# Patient Record
Sex: Female | Born: 1960 | Race: White | Hispanic: No | Marital: Married | Smoking: Never smoker
Health system: Southern US, Community
[De-identification: ages and names within clinical notes are randomized; demographics above are authoritative.]

## PROBLEM LIST (undated history)

## (undated) DIAGNOSIS — IMO0001 Reserved for inherently not codable concepts without codable children: Secondary | ICD-10-CM

## (undated) DIAGNOSIS — I10 Essential (primary) hypertension: Secondary | ICD-10-CM

## (undated) DIAGNOSIS — E079 Disorder of thyroid, unspecified: Secondary | ICD-10-CM

## (undated) DIAGNOSIS — K219 Gastro-esophageal reflux disease without esophagitis: Secondary | ICD-10-CM

## (undated) DIAGNOSIS — E785 Hyperlipidemia, unspecified: Secondary | ICD-10-CM

## (undated) HISTORY — PX: OTHER SURGICAL HISTORY: SHX169

## (undated) HISTORY — DX: Hyperlipidemia, unspecified: E78.5

## (undated) HISTORY — PX: CHOLECYSTECTOMY, LAPAROSCOPIC: SHX56

## (undated) HISTORY — PX: CHOLECYSTECTOMY: SHX55

## (undated) HISTORY — DX: Gastro-esophageal reflux disease without esophagitis: K21.9

## (undated) HISTORY — DX: Disorder of thyroid, unspecified: E07.9

## (undated) HISTORY — PX: TUBAL LIGATION: SHX77

## (undated) HISTORY — DX: Essential (primary) hypertension: I10

---

## 2006-11-25 ENCOUNTER — Other Ambulatory Visit: Payer: Self-pay

## 2006-11-25 ENCOUNTER — Emergency Department: Payer: Self-pay | Admitting: Emergency Medicine

## 2006-11-25 ENCOUNTER — Ambulatory Visit: Payer: Self-pay | Admitting: Emergency Medicine

## 2006-12-29 ENCOUNTER — Ambulatory Visit: Payer: Self-pay | Admitting: Surgery

## 2009-06-07 LAB — CONVERTED CEMR LAB: Pap Smear: NORMAL

## 2009-06-18 ENCOUNTER — Ambulatory Visit: Payer: Self-pay | Admitting: Unknown Physician Specialty

## 2010-02-04 ENCOUNTER — Ambulatory Visit: Payer: Self-pay | Admitting: Family Medicine

## 2010-02-04 DIAGNOSIS — M722 Plantar fascial fibromatosis: Secondary | ICD-10-CM | POA: Insufficient documentation

## 2010-02-04 DIAGNOSIS — I10 Essential (primary) hypertension: Secondary | ICD-10-CM | POA: Insufficient documentation

## 2010-02-04 LAB — CONVERTED CEMR LAB
Bilirubin Urine: NEGATIVE
Nitrite: NEGATIVE
Urobilinogen, UA: 0.2

## 2010-02-05 LAB — CONVERTED CEMR LAB
Albumin: 4.6 g/dL (ref 3.5–5.2)
Alkaline Phosphatase: 47 units/L (ref 39–117)
Basophils Relative: 1.1 % (ref 0.0–3.0)
CO2: 31 meq/L (ref 19–32)
Chloride: 109 meq/L (ref 96–112)
Cholesterol: 238 mg/dL — ABNORMAL HIGH (ref 0–200)
Direct LDL: 156.6 mg/dL
Eosinophils Absolute: 0.1 10*3/uL (ref 0.0–0.7)
Hemoglobin: 13.7 g/dL (ref 12.0–15.0)
MCHC: 33.7 g/dL (ref 30.0–36.0)
MCV: 93.6 fL (ref 78.0–100.0)
Monocytes Absolute: 0.7 10*3/uL (ref 0.1–1.0)
Neutro Abs: 3.7 10*3/uL (ref 1.4–7.7)
RBC: 4.33 M/uL (ref 3.87–5.11)
Sodium: 145 meq/L (ref 135–145)
Total Protein: 7.9 g/dL (ref 6.0–8.3)
Triglycerides: 55 mg/dL (ref 0.0–149.0)

## 2010-02-06 ENCOUNTER — Telehealth: Payer: Self-pay | Admitting: Family Medicine

## 2010-02-20 ENCOUNTER — Ambulatory Visit: Payer: Self-pay | Admitting: Family Medicine

## 2010-02-22 LAB — CONVERTED CEMR LAB
CO2: 33 meq/L — ABNORMAL HIGH (ref 19–32)
Chloride: 102 meq/L (ref 96–112)
Creatinine, Ser: 0.8 mg/dL (ref 0.4–1.2)
Potassium: 4 meq/L (ref 3.5–5.1)

## 2010-05-16 ENCOUNTER — Ambulatory Visit: Payer: Self-pay | Admitting: Family Medicine

## 2010-05-16 DIAGNOSIS — E7800 Pure hypercholesterolemia, unspecified: Secondary | ICD-10-CM | POA: Insufficient documentation

## 2010-05-20 LAB — CONVERTED CEMR LAB
Direct LDL: 150.2 mg/dL
HDL: 52 mg/dL (ref >39.00)
Total CHOL/HDL Ratio: 4
Triglycerides: 70 mg/dL (ref 0.0–149.0)
VLDL: 14 mg/dL (ref 0.0–40.0)

## 2010-05-22 ENCOUNTER — Ambulatory Visit: Payer: Self-pay | Admitting: Family Medicine

## 2010-07-08 LAB — HM MAMMOGRAPHY: HM Mammogram: NORMAL

## 2010-07-08 LAB — HM PAP SMEAR

## 2010-07-23 ENCOUNTER — Ambulatory Visit: Payer: Self-pay | Admitting: Unknown Physician Specialty

## 2010-08-01 ENCOUNTER — Ambulatory Visit: Payer: Self-pay | Admitting: Unknown Physician Specialty

## 2010-08-26 ENCOUNTER — Ambulatory Visit: Payer: Self-pay | Admitting: Family Medicine

## 2010-09-03 LAB — CONVERTED CEMR LAB
ALT: 16 units/L (ref 0–35)
AST: 20 units/L (ref 0–37)
Albumin: 4.3 g/dL (ref 3.5–5.2)
Alkaline Phosphatase: 51 units/L (ref 39–117)
BUN: 18 mg/dL (ref 6–23)
CO2: 31 meq/L (ref 19–32)
Chloride: 103 meq/L (ref 96–112)
Direct LDL: 166.6 mg/dL
Glucose, Bld: 95 mg/dL (ref 70–99)
HDL: 57 mg/dL (ref >39.00)
Potassium: 4.5 meq/L (ref 3.5–5.1)
Total Protein: 7.4 g/dL (ref 6.0–8.3)

## 2010-11-19 ENCOUNTER — Ambulatory Visit: Payer: Self-pay | Admitting: Family Medicine

## 2010-11-19 DIAGNOSIS — L719 Rosacea, unspecified: Secondary | ICD-10-CM

## 2010-11-27 ENCOUNTER — Ambulatory Visit: Payer: Self-pay | Admitting: Family Medicine

## 2010-12-03 LAB — CONVERTED CEMR LAB
ALT: 16 units/L (ref 0–35)
Cholesterol: 207 mg/dL — ABNORMAL HIGH (ref 0–200)
HDL: 68.2 mg/dL (ref >39.00)
Total CHOL/HDL Ratio: 3
Triglycerides: 56 mg/dL (ref 0.0–149.0)

## 2010-12-08 HISTORY — PX: PLANTAR FASCIA SURGERY: SHX746

## 2011-01-07 NOTE — Progress Notes (Signed)
Summary: Want lab results and discuss medication   Phone Note Call from Patient   Summary of Call: Pt called for lab results...mailed pt a copy.. Pt says Dr. Ermalene Searing discuss some medication she wants her to try, after her labs results. Please call pt to discuss -Told pt someone would call her back on tomorrow, pt ok.Daine Gip  February 06, 2010 4:43 PM   Initial call taken by: Daine Gip,  February 06, 2010 4:42 PM  Follow-up for Phone Call        Spoke with pt, she wanted medicine called to [facility], which I did.  Cancelled script at [facility] in [place] Follow-up by: Lowella Petties CMA,  February 07, 2010 4:36 PM    New/Updated Medications: LISINOPRIL-HYDROCHLOROTHIAZIDE 10-12.5 MG TABS (LISINOPRIL-HYDROCHLOROTHIAZIDE) 1 tab by mouth daily Prescriptions: LISINOPRIL-HYDROCHLOROTHIAZIDE 10-12.5 MG TABS (LISINOPRIL-HYDROCHLOROTHIAZIDE) 1 tab by mouth daily  #30 x 3   Entered by:   Lowella Petties CMA   Authorized by:   Kerby Nora MD   Signed by:   Lowella Petties CMA on 02/07/2010   Method used:   Electronically to        [facility]* (retail)       [address] [place], [place]  [address]       Ph: [phone]       Fax: [phone]   RxID:   [id number] LISINOPRIL-HYDROCHLOROTHIAZIDE 10-12.5 MG TABS (LISINOPRIL-HYDROCHLOROTHIAZIDE) 1 tab by mouth daily  #30 x 3   Entered and Authorized by:   Kerby Nora MD   Signed by:   Kerby Nora MD on 02/06/2010   Method used:   Electronically to        [facility]* (retail)       [address] [place], [place]  [address]       Ph: [phone]       Fax: [phone]   RxID:   [id number]

## 2011-01-07 NOTE — Assessment & Plan Note (Signed)
Summary: 2 week follow up bp/rbh   Vital Signs:  Patient profile:   50 year old female Height:      63.5 inches Weight:      180.4 pounds BMI:     31.57 Temp:     98.1 degrees F oral Pulse rate:   80 / minute Pulse rhythm:   regular BP sitting:   118 / 80  (left arm) Cuff size:   regular  Vitals Entered By: Benny Lennert CMA Duncan Dull) (February 20, 2010 3:40 PM)  History of Present Illness: Chief complaint 2 wk follow up Blood pressure  HTN, improved control on low dose lisinopril HCTZ. Has had cough occur in apst few day.  BPs 117-120/74-89  HR 76.  Has started weight wathcer..has lost 5 lbs. Walking daily.  Problems Prior to Update: 1)  Neoplasm, Malignant, Colon, Family Hx, Father  (ICD-V16.0) 2)  Essential Hypertension, Benign  (ICD-401.1) 3)  Fasciitis, Plantar  (ICD-728.71)  Allergies (verified): No Known Drug Allergies  Past History:  Past medical, surgical, family and social histories (including risk factors) reviewed, and no changes noted (except as noted below).  Past Surgical History: Reviewed history from 02/04/2010 and no changes required. 2 C Section for eclampsia and repeat Cholecystectomy Plantar fasciitis surgery 12/2009  Family History: Reviewed history from 02/04/2010 and no changes required. father: rectal cancer, lung cancer, Crohn's mother: COPD brother: HTN sister: Crohn's PGM: DM MGF: MI age 55s  Social History: Reviewed history from 02/04/2010 and no changes required. 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 CV:  Denies chest pain or discomfort and swelling of feet. Resp:  Complains of cough; denies shortness of breath, sputum productive, and wheezing.  Physical Exam  General:  Overweight appearing female in NAd Mouth:  Oral mucosa and oropharynx without lesions or exudates.  Teeth in good  repair. Neck:  no carotid bruit or thyromegaly no cervical or supraclavicular lymphadenopathy  Lungs:  Normal respiratory effort, chest expands symmetrically. Lungs are clear to auscultation, no crackles or wheezes. Heart:  Normal rate and regular rhythm. S1 and S2 normal without gallop, murmur, click, rub or other extra sounds. Pulses:  R and L posterior tibial pulses are full and equal bilaterally  Extremities:  no edema    Impression & Recommendations:  Problem # 1:  ESSENTIAL HYPERTENSION, BENIGN (ICD-401.1) EKG today showed NSR, no ST change, no LVH.  Change to ARB due to cough with ACEI. Follow BPs at home. Continue lifestyle change..follow up in 6 months. Call earlier if BP >140/90.  Her updated medication list for this problem includes:    Losartan Potassium-hctz 50-12.5 Mg Tabs (Losartan potassium-hctz) .Marland Kitchen... Take 1 tablet by mouth once a day  Orders: EKG w/ Interpretation (93000) TLB-BMP (Basic Metabolic Panel-BMET) (80048-METABOL)  Complete Medication List: 1)  Prilosec 20 Mg Cpdr (Omeprazole) .... Daily 2)  Losartan Potassium-hctz 50-12.5 Mg Tabs (Losartan potassium-hctz) .... Take 1 tablet by mouth once a day  Patient Instructions: 1)  Make appt for labs in 3 months for fasting LIPIDS Dx 272.0 2)  Stop lisinopril HCTZ... change to losartan HCTZ. 3)  Please schedule a follow-up appointment in 6 months .  4)  Follow BPs at home, call i fgreater than 140/90.  Prescriptions: LOSARTAN POTASSIUM-HCTZ 50-12.5 MG TABS (LOSARTAN POTASSIUM-HCTZ) Take 1 tablet by mouth once a day  #30 x 11   Entered and Authorized by:  Kerby Nora MD   Signed by:   Kerby Nora MD on 02/20/2010   Method used:   Electronically to        [id number] (ret[facility]       [address]       [place], [place]  [address]       Ph: [phone]       Fax: [phone]   RxID:   [id number]   Current Allergies (reviewed today): No known allergies

## 2011-01-07 NOTE — Assessment & Plan Note (Signed)
Summary: NEW PATIENT TO ESTABLISH   Vital Signs:  Patient profile:   50 year old female Height:      63.5 inches Weight:      185.6 pounds BMI:     32.48 Temp:     98.0 degrees F oral Pulse rate:   80 / minute Pulse rhythm:   regular BP sitting:   160 / 100  (left arm) Cuff size:   large  Vitals Entered By: Benny Lennert CMA Duncan Dull) (February 04, 2010 10:26 AM)  History of Present Illness: new patient to be established and high blood pressure  High blood pressure: Noted at last GYN appt. Some headaches, lightheadedness. 25 lb weight gain in last 2 years.  131-150/92-103  Starts at [employer] at work.. walk on track. Plans on starting weight watchers.   Preventive Screening-Counseling & Management  Alcohol-Tobacco     Smoking Status: never  Caffeine-Diet-Exercise     Does Patient Exercise: no      Drug Use:  no.    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 CV:  Denies chest pain or discomfort. Resp:  Denies shortness of breath. GI:  Denies abdominal pain. GU:  Denies dysuria and hematuria; last menstrual cycle  11/2009. Derm:  Denies rash. Psych:  Denies anxiety and depression. Endo:  Denies cold intolerance and heat intolerance.  Physical Exam  General:  Overweight appearing female in NAd Eyes:  No corneal or conjunctival inflammation noted. EOMI. Perrla. Funduscopic exam benign, without  hemorrhages, exudates or papilledema. Vision grossly normal. Mouth:  Oral mucosa and oropharynx without lesions or exudates.  Teeth in good repair. Neck:  no carotid bruit or thyromegaly no cervical or supraclavicular lymphadenopathy  Lungs:  Normal respiratory effort, chest expands symmetrically. Lungs are clear to auscultation, no crackles or wheezes. Heart:  Normal rate and regular rhythm. S1 and S2 normal without gallop, murmur, click, rub or other extra sounds. Abdomen:  Bowel sounds positive,abdomen soft and non-tender without masses, organomegaly or hernias noted. Pulses:  R and L posterior tibial pulses are full and equal bilaterally  Extremities:  no edema  Neurologic:  No cranial nerve deficits noted. Station and gait are normal.  Sensory, motor and coordinative functions appear intact. Skin:  Intact without suspicious lesions or rashes Psych:  Cognition and judgment appear intact. Alert and cooperative with normal attention span and concentration. No apparent delusions, illusions, hallucinations   Impression & Recommendations:  Problem # 1:  ESSENTIAL HYPERTENSION, BENIGN (ICD-401.1)  New diagnosis. Will eval for secondary causes, risk factors and end organ damage. EKG at next OV.  UA clear and eye exam normal.  Encouraged exercise, weight loss, healthy eating habits. Info given. Reviewed low salt diet, increasing magnesium, potassium and calcium.  If labs are noramle..will plan on starting HCTZ/lisinopril . Reviewed medicaiotn SE profile i detail.  45 min spent coordinating care and face to face with pt counsleing on new diagnosis and treatment.  Orders: UA Dipstick w/o Micro (manual) (01601) TLB-BMP (Basic Metabolic Panel-BMET) (  80048-METABOL) TLB-CBC Platelet - w/Differential (85025-CBCD) TLB-Hepatic/Liver Function Pnl (80076-HEPATIC) TLB-TSH (Thyroid Stimulating Hormone) (84443-TSH) TLB-Lipid Panel (80061-LIPID)  Her updated medication list for this problem includes:     Lisinopril-hydrochlorothiazide 10-12.5 Mg Tabs (Lisinopril-hydrochlorothiazide) .Marland Kitchen... 1 tab by mouth daily  Complete Medication List: 1)  Prilosec 20 Mg Cpdr (Omeprazole) .... Daily 2)  Lisinopril-hydrochlorothiazide 10-12.5 Mg Tabs (Lisinopril-hydrochlorothiazide) .Marland Kitchen.. 1 tab by mouth daily  Patient Instructions: 1)  Work on exercise, weight loss and low salt diet.  2)  Will call with lab results..will likely start lisinopril/HCTZ 10/12.5 mg daily. 3)  Follow up appt in 2 weeks BP.   Current Allergies (reviewed today): No known allergies   Flex Sig Next Due:  Not Indicated Colonoscopy Next Due:  5 yr Hemoccult Next Due:  Not Indicated PAP Result Date:  06/07/2009 PAP Result:  normal PAP Next Due:  1 yr Mammogram Result Date:  06/07/2009 Mammogram Result:  normal Mammogram Next Due:  1 yr  Laboratory Results   Urine Tests  Date/Time Received: February 04, 2010 11:30 AM  Date/Time Reported: February 04, 2010 11:30 AM   Routine Urinalysis   Color: yellow Appearance: Clear Glucose: negative   (Normal Range: Negative) Bilirubin: negative   (Normal Range: Negative) Ketone: negative   (Normal Range: Negative) Spec. Gravity: 1.015   (Normal Range: 1.003-1.035) Blood: negative   (Normal Range: Negative) pH: 6.0   (Normal Range: 5.0-8.0) Protein: negative   (Normal Range: Negative) Urobilinogen: 0.2   (Normal Range: 0-1) Nitrite: negative   (Normal Range: Negative) Leukocyte Esterace: negative   (Normal Range: Negative)        Appended Document: Orders Update please call pt..let her know last colonoscopy 2003... Dr. Esmeralda Arthur recommended repeat every 5 years iven family history...overdue. WIll send her for GI referral for colonoscopy. Kerby Nora MD  February 19, 2010 5:48 PM   Spoke with pt on cell # [phone]. Patient notified as instructed by telephone. Pt will wait to hear from [facility].Lewanda Rife LPN  February 22, 2010 8:12 AM  Patient will call back with appt date and  time with appt with Dr Volney Presser for her  colonoscopy. Clinical Lists Changes  Problems: Added new problem of NEOPLASM, MALIGNANT, COLON, FAMILY HX, FATHER (ICD-V16.0) - Signed Orders: Added new Referral order of Gastroenterology Referral (GI) - Signed

## 2011-01-07 NOTE — Assessment & Plan Note (Signed)
Summary: 3 M F/U DLO   Vital Signs:  Patient profile:   50 year old female Height:      63.5 inches Weight:      178.2 pounds BMI:     31.18 Temp:     98.1 degrees F oral Pulse rate:   80 / minute Pulse rhythm:   regular BP sitting:   120 / 84  (left arm) Cuff size:   large  Vitals Entered By: Benny Lennert CMA Duncan Dull) (May 22, 2010 3:15 PM)  History of Present Illness: CC: f/up   HTN, well controlled on losartan/HCTZ...BP at home runnning at goal..forgot med today.  High Cholesterol... Has been working on diet and exercise  2-3 times a week.   Problems Prior to Update: 1)  Screening For Lipoid Disorders  (ICD-V77.91) 2)  Neoplasm, Malignant, Colon, Family Hx, Father  (ICD-V16.0) 3)  Essential Hypertension, Benign  (ICD-401.1) 4)  Fasciitis, Plantar  (ICD-728.71)  Current Medications (verified): 1)  Prilosec 20 Mg Cpdr (Omeprazole) .... Daily 2)  Losartan Potassium-Hctz 50-12.5 Mg Tabs (Losartan Potassium-Hctz) .... Take 1 Tablet By Mouth Once A Day  Allergies (verified): No Known Drug Allergies  Past History:  Past medical, surgical, family and social histories (including risk factors) reviewed, and no changes noted (except as noted below).  Past Surgical History: Reviewed history from 02/04/2010 and no changes required. 2 C Section for eclampsia and repeat Cholecystectomy Plantar fasciitis surgery 12/2009  Family History: Reviewed history from 02/04/2010 and no changes required. father: rectal cancer, lung cancer, Crohn's mother: COPD brother: HTN sister: Crohn's PGM: DM MGF: MI age 24s  Social History: Reviewed history from 02/04/2010 and no changes required. 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 CV:  Denies chest pain or discomfort and swelling of feet. Resp:  Denies cough and shortness of breath. GI:   Denies abdominal pain. GU:  Denies dysuria.  Physical Exam  General:  Well-developed,well-nourished,in no acute distress; alert,appropriate and cooperative throughout examination Mouth:  Oral mucosa and oropharynx without lesions or exudates.  Teeth in good repair. Neck:  no carotid bruit or thyromegaly no cervical or supraclavicular lymphadenopathy  Lungs:  Normal respiratory effort, chest expands symmetrically. Lungs are clear to auscultation, no crackles or wheezes. Heart:  Normal rate and regular rhythm. S1 and S2 normal without gallop, murmur, click, rub or other extra sounds. Pulses:  R and L posterior tibial pulses are full and equal bilaterally  Extremities:  no edema    Impression & Recommendations:  Problem # 1:  ESSENTIAL HYPERTENSION, BENIGN (ICD-401.1) Well controlled. Continue current medication.  Her updated medication list for this problem includes:    Losartan Potassium-hctz 50-12.5 Mg Tabs (Losartan potassium-hctz) .Marland Kitchen... Take 1 tablet by mouth once a day  Problem # 2:  HYPERCHOLESTEROLEMIA (ICD-272.0) Improved but LDL not at goal <130. Discussed lifestyle change and weight loss. Recheck fasting LIPIDS, in 3 months Dx 272.0     Complete Medication List: 1)  Prilosec 20 Mg Cpdr (Omeprazole) .... Daily 2)  Losartan Potassium-hctz 50-12.5 Mg Tabs (Losartan potassium-hctz) .... Take 1 tablet by mouth once a day  Patient Instructions: 1)  Follow up in 6 months. BP check.  2)    3)  Lipid panel, CMET dX 272.0 in 3 months FASTING.  Current Allergies (reviewed today): No known allergies

## 2011-01-09 NOTE — Assessment & Plan Note (Signed)
Summary: 6 MONTH FOLLOW UP BP CHECK/RBH   Vital Signs:  Patient profile:   50 year old female Height:      63.5 inches Weight:      189.4 pounds BMI:     33.14 Temp:     98.1 degrees F oral Pulse rate:   80 / minute Pulse rhythm:   regular BP sitting:   108 / 78  (left arm) Cuff size:   large  Vitals Entered By: Benny Lennert CMA Duncan Dull) (November 19, 2010 3:13 PM)  History of Present Illness: Chief complaint 6 month up blood pressure  HTN, well controlled on losartan/HCTZ...BP at home runnning at goal.  10 lb weight gain.   High Cholesterol... Has been working on diet and exercise  2-3 times a week. Not at goal.. LDL >160 now. On pravastain 20 mg daily... very good about taking it. HAs scheduled 3 month check next week.    Lipid Management History:      Positive NCEP/ATP III risk factors include hypertension.  Negative NCEP/ATP III risk factors include female age less than 74 years old and non-tobacco-user status.     Preventive Screening-Counseling & Management  Caffeine-Diet-Exercise     Does Patient Exercise: yes     Exercise (avg: min/session): 30-60     Times/week: <3     Exercise Counseling: to improve exercise regimen  Problems Prior to Update: 1)  Hypercholesterolemia  (ICD-272.0) 2)  Neoplasm, Malignant, Colon, Family Hx, Father  (ICD-V16.0) 3)  Essential Hypertension, Benign  (ICD-401.1) 4)  Fasciitis, Plantar  (ICD-728.71)  Current Medications (verified): 1)  Prilosec 20 Mg Cpdr (Omeprazole) .... Daily 2)  Losartan Potassium-Hctz 50-12.5 Mg Tabs (Losartan Potassium-Hctz) .... Take 1 Tablet By Mouth Once A Day 3)  Pravastatin Sodium 20 Mg Tabs (Pravastatin Sodium) .... Take 1 Tablet By Mouth Once A Day  Allergies (verified): No Known Drug Allergies  Past History:  Past medical, surgical, family and social histories (including risk factors) reviewed, and no changes noted (except as noted below).  Past Surgical History: Reviewed history from  02/04/2010 and no changes required. 2 C Section for eclampsia and repeat Cholecystectomy Plantar fasciitis surgery 12/2009  Family History: Reviewed history from 02/04/2010 and no changes required. father: rectal cancer, lung cancer, Crohn's mother: COPD brother: HTN sister: Crohn's PGM: DM MGF: MI age 94s  Social History: Reviewed history from 02/04/2010 and no changes required. 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 CV:  Denies chest pain or discomfort. Resp:  Denies shortness of breath. GI:  Denies abdominal pain. GU:  Denies dysuria.  Physical Exam  General:  overweight female in NAD  Ears:  External ear exam shows no significant lesions or deformities.  Otoscopic examination reveals clear canals, tympanic membranes are intact bilaterally without bulging, retraction, inflammation or discharge. Hearing is grossly normal bilaterally. Nose:  External nasal examination shows no deformity or inflammation. Nasal mucosa are pink and moist without lesions or exudates. Mouth:  Oral mucosa and oropharynx without lesions or exudates.  Teeth in good repair. Neck:  no carotid bruit or thyromegaly no cervical or supraclavicular lymphadenopathy  Lungs:  Normal respiratory effort, chest expands symmetrically. Lungs are clear to auscultation, no crackles or wheezes. Heart:  Normal rate and regular rhythm. S1 and S2 normal without gallop, murmur, click, rub or other extra sounds. Abdomen:  Bowel sounds positive,abdomen soft  and non-tender without masses, organomegaly or hernias noted. Pulses:  R and L posterior tibial pulses are full and equal bilaterally  Extremities:  no edema  Skin:  erythrma and papules across nose , chin.. consistent with rosecea.  Psych:  Cognition and judgment appear intact. Alert and cooperative  with normal attention span and concentration. No apparent delusions, illusions, hallucinations   Impression & Recommendations:  Problem # 1:  ESSENTIAL HYPERTENSION, BENIGN (ICD-401.1) Well controlled. Continue current medication.  Her updated medication list for this problem includes:    Losartan Potassium-hctz 50-12.5 Mg Tabs (Losartan potassium-hctz) .Marland Kitchen... 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   Problem # 3:  HYPERCHOLESTEROLEMIA (ICD-272.0) Due for reeval... next week. Encouraged exercise, weight loss, healthy eating habits.  Her updated medication list for this problem includes:    Pravastatin Sodium 20 Mg Tabs (Pravastatin sodium) .Marland Kitchen... Take 1 tablet by mouth once a day  Complete Medication List: 1)  Prilosec 20 Mg Cpdr (Omeprazole) .... Daily 2)  Losartan Potassium-hctz 50-12.5 Mg Tabs (Losartan potassium-hctz) .... Take 1 tablet by mouth once a day 3)  Pravastatin Sodium 20 Mg Tabs (Pravastatin sodium) .... Take 1 tablet by mouth once a day 4)  Metronidazole 0.75 % Gel (Metronidazole) .... Apply to affected area two times a day  Lipid Assessment/Plan:      Based on NCEP/ATP III, the patient's risk factor category is "0-1 risk factors".  The patient's lipid goals are as follows: Total cholesterol goal is 200; LDL cholesterol goal is 160; HDL cholesterol goal is 40; Triglyceride goal is 150.  Her LDL cholesterol goal has not been met.    Patient Instructions: 1)  Increase cardiovascular exercise.Marland Kitchen 3-5 times a week.  2)  Please schedule a follow-up appointment in 1 year BP check.  Prescriptions: METRONIDAZOLE 0.75 % GEL (METRONIDAZOLE) Apply to affected Area two times a day  #15-30 gm x 1   Entered and Authorized by:   Kerby Nora MD   Signed by:   Kerby Nora MD on 11/19/2010   Method used:   Electronically to        [id number]       [address]        [place], [place]  [address]       Ph: [phone]       Fax: [phone]   RxID:   [id number]    Orders Added: 1)  Est. Patient Level IV [24401]    Current Allergies (reviewed today): No known allergies   Flu Vaccine Result Date:  09/07/2010 Flu Vaccine Result:  given Flu Vaccine Next Due:  1 yr Last PAP:  normal (06/07/2009 11:30:42 PM) PAP Result Date:  07/08/2010 PAP Result:  normal PAP Next Due:  1 yr Last Mammogram:  normal (06/07/2009 11:30:42 PM) Mammogram Result Date:  07/08/2010 Mammogram Result:  normal Mammogram Next Due:  1 yr Very last menses was in June.. minimal hot flashes

## 2011-04-21 ENCOUNTER — Telehealth: Payer: Self-pay | Admitting: *Deleted

## 2011-04-21 DIAGNOSIS — E7800 Pure hypercholesterolemia, unspecified: Secondary | ICD-10-CM

## 2011-04-21 NOTE — Telephone Encounter (Signed)
Patient is due to have lab work done in June. Patient does not know what she needs to have drawn? Please advise patient.

## 2011-04-23 NOTE — Telephone Encounter (Signed)
Addended by: Liane Comber on: 04/23/2011 08:05 AM   Modules accepted: Orders

## 2011-04-23 NOTE — Telephone Encounter (Signed)
Patient advised via message on home machine ok to schedule lab appt in june

## 2011-08-31 ENCOUNTER — Other Ambulatory Visit: Payer: Self-pay | Admitting: Family Medicine

## 2011-09-09 ENCOUNTER — Ambulatory Visit: Payer: Self-pay | Admitting: Unknown Physician Specialty

## 2012-03-04 ENCOUNTER — Telehealth: Payer: Self-pay | Admitting: Family Medicine

## 2012-03-04 DIAGNOSIS — E7800 Pure hypercholesterolemia, unspecified: Secondary | ICD-10-CM

## 2012-03-04 DIAGNOSIS — I10 Essential (primary) hypertension: Secondary | ICD-10-CM

## 2012-03-04 NOTE — Telephone Encounter (Signed)
Message copied by Excell Seltzer on Thu Mar 04, 2012 12:23 PM ------      Message from: Alvina Chou      Created: Thu Mar 04, 2012 11:00 AM       Patient is scheduled for [facility] labs, please order future labs, Thanks , Camelia Eng

## 2012-03-06 ENCOUNTER — Other Ambulatory Visit: Payer: Self-pay | Admitting: Family Medicine

## 2012-03-11 ENCOUNTER — Other Ambulatory Visit (INDEPENDENT_AMBULATORY_CARE_PROVIDER_SITE_OTHER): Payer: BC Managed Care – PPO

## 2012-03-11 DIAGNOSIS — I10 Essential (primary) hypertension: Secondary | ICD-10-CM

## 2012-03-11 DIAGNOSIS — E7800 Pure hypercholesterolemia, unspecified: Secondary | ICD-10-CM

## 2012-03-11 LAB — COMPREHENSIVE METABOLIC PANEL
Albumin: 4.3 g/dL (ref 3.5–5.2)
Alkaline Phosphatase: 55 U/L (ref 39–117)
BUN: 17 mg/dL (ref 6–23)
CO2: 30 mEq/L (ref 19–32)
Glucose, Bld: 98 mg/dL (ref 70–99)
Potassium: 4.3 mEq/L (ref 3.5–5.1)
Total Bilirubin: 0.6 mg/dL (ref 0.3–1.2)

## 2012-03-11 LAB — LIPID PANEL: HDL: 66.7 mg/dL (ref >39.00)

## 2012-05-06 ENCOUNTER — Other Ambulatory Visit: Payer: Self-pay | Admitting: Family Medicine

## 2012-06-28 ENCOUNTER — Encounter: Payer: Self-pay | Admitting: Family Medicine

## 2012-06-29 ENCOUNTER — Ambulatory Visit (INDEPENDENT_AMBULATORY_CARE_PROVIDER_SITE_OTHER): Payer: BC Managed Care – PPO | Admitting: Family Medicine

## 2012-06-29 ENCOUNTER — Encounter: Payer: Self-pay | Admitting: Family Medicine

## 2012-06-29 VITALS — BP 110/70 | HR 72 | Temp 98.5°F | Ht 63.5 in | Wt 190.8 lb

## 2012-06-29 DIAGNOSIS — I10 Essential (primary) hypertension: Secondary | ICD-10-CM

## 2012-06-29 DIAGNOSIS — E7800 Pure hypercholesterolemia, unspecified: Secondary | ICD-10-CM

## 2012-06-29 MED ORDER — LOSARTAN POTASSIUM-HCTZ 50-12.5 MG PO TABS: ORAL_TABLET | ORAL | Status: DC

## 2012-06-29 NOTE — Patient Instructions (Addendum)
Get back on track with healthy eating and exercise. Schedule [facility] with NO fasting labs prior in next few months.

## 2012-06-29 NOTE — Assessment & Plan Note (Signed)
Well controlled. Continue current medication.  

## 2012-06-29 NOTE — Assessment & Plan Note (Signed)
Encouraged exercise, weight loss, healthy eating habits. Get back on track.. Plan to recheck in 3 months.. Will schedule at [facility].

## 2012-06-29 NOTE — Progress Notes (Signed)
  Subjective:    Patient ID: Summer Hernandez, female    DOB: 1961-05-26, 51 y.o.   MRN: [id number]  HPI  Hypertension:  Well controlled on losartan HCTZ 50/12.5 mg daily  Using medication without problems or lightheadedness:  None Chest pain with exertion:None Edema:None Short of breath:None Average home BPs: not checking Other issues: Weight has fluctuated some up and down due to stress  Elevated Cholesterol: LDL at goal <160 but barely; pravastatin 20 mg daily Lab Results  Component Value Date   CHOL 234* 03/11/2012   HDL 66.70 03/11/2012   LDLDIRECT 156.5 03/11/2012   TRIG 105.0 03/11/2012   CHOLHDL 4 03/11/2012  Using medications without problems: None Muscle aches: None Diet compliance: poor Exercise:None Other complaints:   Sees GYN but plans to come here now. Next CPX is in 8/21.. Will change to here.  Review of Systems  Constitutional: Negative for fever and fatigue.  HENT: Negative for ear pain.   Eyes: Negative for pain.  Respiratory: Negative for chest tightness and shortness of breath.   Cardiovascular: Negative for chest pain, palpitations and leg swelling.  Gastrointestinal: Negative for abdominal pain.  Genitourinary: Negative for dysuria.       Objective:   Physical Exam  Constitutional: Vital signs are normal. She appears well-developed and well-nourished. She is cooperative.  Non-toxic appearance. She does not appear ill. No distress.  HENT:  Head: Normocephalic.  Right Ear: Hearing, tympanic membrane, external ear and ear canal normal. Tympanic membrane is not erythematous, not retracted and not bulging.  Left Ear: Hearing, tympanic membrane, external ear and ear canal normal. Tympanic membrane is not erythematous, not retracted and not bulging.  Nose: No mucosal edema or rhinorrhea. Right sinus exhibits no maxillary sinus tenderness and no frontal sinus tenderness. Left sinus exhibits no maxillary sinus tenderness and no frontal sinus tenderness.  Mouth/Throat:  Uvula is midline, oropharynx is clear and moist and mucous membranes are normal.  Eyes: Conjunctivae, EOM and lids are normal. Pupils are equal, round, and reactive to light. No foreign bodies found.  Neck: Trachea normal and normal range of motion. Neck supple. Carotid bruit is not present. No mass and no thyromegaly present.  Cardiovascular: Normal rate, regular rhythm, S1 normal, S2 normal, normal heart sounds, intact distal pulses and normal pulses.  Exam reveals no gallop and no friction rub.   No murmur heard. Pulmonary/Chest: Effort normal and breath sounds normal. Not tachypneic. No respiratory distress. She has no decreased breath sounds. She has no wheezes. She has no rhonchi. She has no rales.  Abdominal: Soft. Normal appearance and bowel sounds are normal. There is no tenderness.  Neurological: She is alert.  Skin: Skin is warm, dry and intact. No rash noted.  Psychiatric: Her speech is normal and behavior is normal. Judgment and thought content normal. Her mood appears not anxious. Cognition and memory are normal. She does not exhibit a depressed mood.          Assessment & Plan:

## 2012-09-02 ENCOUNTER — Encounter: Payer: BC Managed Care – PPO | Admitting: Family Medicine

## 2012-09-15 ENCOUNTER — Other Ambulatory Visit: Payer: Self-pay | Admitting: Family Medicine

## 2012-10-01 ENCOUNTER — Encounter: Payer: Self-pay | Admitting: Family Medicine

## 2012-10-01 ENCOUNTER — Ambulatory Visit (INDEPENDENT_AMBULATORY_CARE_PROVIDER_SITE_OTHER): Payer: BC Managed Care – PPO | Admitting: Family Medicine

## 2012-10-01 ENCOUNTER — Other Ambulatory Visit (HOSPITAL_COMMUNITY)
Admission: RE | Admit: 2012-10-01 | Discharge: 2012-10-01 | Disposition: A | Discharge status: Home / Self Care | Payer: BC Managed Care – PPO | Source: Ambulatory Visit | Attending: Family Medicine | Admitting: Family Medicine

## 2012-10-01 VITALS — BP 128/88 | HR 86 | Temp 97.6°F | Ht 63.0 in | Wt 194.0 lb

## 2012-10-01 DIAGNOSIS — Z01419 Encounter for gynecological examination (general) (routine) without abnormal findings: Secondary | ICD-10-CM

## 2012-10-01 DIAGNOSIS — Z1151 Encounter for screening for human papillomavirus (HPV): Secondary | ICD-10-CM | POA: Insufficient documentation

## 2012-10-01 DIAGNOSIS — I10 Essential (primary) hypertension: Secondary | ICD-10-CM

## 2012-10-01 DIAGNOSIS — Z1231 Encounter for screening mammogram for malignant neoplasm of breast: Secondary | ICD-10-CM

## 2012-10-01 DIAGNOSIS — Z0000 Encounter for general adult medical examination without abnormal findings: Secondary | ICD-10-CM

## 2012-10-01 DIAGNOSIS — E7800 Pure hypercholesterolemia, unspecified: Secondary | ICD-10-CM

## 2012-10-01 LAB — POCT URINALYSIS DIPSTICK
Blood, UA: NEGATIVE
Glucose, UA: NEGATIVE
Nitrite, UA: NEGATIVE
Urobilinogen, UA: NEGATIVE

## 2012-10-01 NOTE — Addendum Note (Signed)
Addended by: Criselda Peaches B on: 10/01/2012 04:25 PM   Modules accepted: Orders

## 2012-10-01 NOTE — Progress Notes (Signed)
Subjective:    Patient ID: Summer Hernandez, female    DOB: [birth year], 51 y.o.   MRN: [id number]  HPI  The patient is here for annual wellness exam and preventative care.    Has been struggling with stress from job issue and mother passing away. Doing better in last week with mood.  No exercise. Diet: poor. Wt Readings from Last 3 Encounters:  10/01/12 194 lb (87.998 kg)  06/29/12 190 lb 12 oz (86.524 kg)  11/19/10 189 lb 6.4 oz (85.911 kg)       Review of Systems  Constitutional: Negative for fever and fatigue.  HENT: Negative for ear pain.   Eyes: Negative for pain.  Respiratory: Negative for chest tightness and shortness of breath.   Cardiovascular: Negative for chest pain, palpitations and leg swelling.  Gastrointestinal: Negative for abdominal pain.  Genitourinary: Negative for dysuria.       Objective:   Physical Exam  Constitutional: Vital signs are normal. She appears well-developed and well-nourished. She is cooperative.  Non-toxic appearance. She does not appear ill. No distress.  HENT:  Head: Normocephalic.  Right Ear: Hearing, tympanic membrane, external ear and ear canal normal.  Left Ear: Hearing, tympanic membrane, external ear and ear canal normal.  Nose: Nose normal.  Eyes: Conjunctivae normal, EOM and lids are normal. Pupils are equal, round, and reactive to light. No foreign bodies found.  Neck: Trachea normal and normal range of motion. Neck supple. Carotid bruit is not present. No mass and no thyromegaly present.  Cardiovascular: Normal rate, regular rhythm, S1 normal, S2 normal, normal heart sounds and intact distal pulses.  Exam reveals no gallop.   No murmur heard. Pulmonary/Chest: Effort normal and breath sounds normal. No respiratory distress. She has no wheezes. She has no rhonchi. She has no rales.  Abdominal: Soft. Normal appearance and bowel sounds are normal. She exhibits no distension, no fluid wave, no abdominal bruit and no mass. There is no  hepatosplenomegaly. There is no tenderness. There is no rebound, no guarding and no CVA tenderness. No hernia.  Genitourinary: Vagina normal and uterus normal. No breast swelling, tenderness, discharge or bleeding. Pelvic exam was performed with patient prone. There is no rash, tenderness or lesion on the right labia. There is no rash, tenderness or lesion on the left labia. Uterus is not enlarged and not tender. Cervix exhibits no motion tenderness, no discharge and no friability. Right adnexum displays no mass, no tenderness and no fullness. Left adnexum displays no mass, no tenderness and no fullness.  Lymphadenopathy:    She has no cervical adenopathy.    She has no axillary adenopathy.  Neurological: She is alert. She has normal strength. No cranial nerve deficit or sensory deficit.  Skin: Skin is warm, dry and intact. No rash noted.  Psychiatric: Her speech is normal and behavior is normal. Judgment normal. Her mood appears not anxious. Cognition and memory are normal. She does not exhibit a depressed mood.          Assessment & Plan:  The patient's preventative maintenance and recommended screening tests for an annual wellness exam were reviewed in full today. Brought up to date unless services declined.  Counselled on the importance of diet, exercise, and its role in overall health and mortality. The patient's FH and SH was reviewed, including their home life, tobacco status, and drug and alcohol status.   Vaccines: Tdap and  Flu received at work. Colon: 01/2010 nml,  In [facility], repeat in 5 years  given father with anal cancer  Mammo: due  PAP/DVE: Last year at GYN, plans PAP/DVE here from now on.  Nonsmoker:

## 2012-10-01 NOTE — Assessment & Plan Note (Signed)
Well controlled. Continue current medication.  

## 2012-10-01 NOTE — Patient Instructions (Addendum)
Work on weight loss, regular exercise 3-5, healthy eating habits.  Stop by front desk to set up Mammogram.  We will call you with pap smear results.

## 2012-10-11 ENCOUNTER — Encounter: Payer: Self-pay | Admitting: Family Medicine

## 2012-10-11 LAB — HM PAP SMEAR: HM Pap smear: NORMAL

## 2012-10-12 ENCOUNTER — Ambulatory Visit: Payer: Self-pay | Admitting: Family Medicine

## 2012-10-14 LAB — HM MAMMOGRAPHY

## 2012-10-15 ENCOUNTER — Encounter: Payer: Self-pay | Admitting: Family Medicine

## 2012-10-26 ENCOUNTER — Ambulatory Visit: Payer: Self-pay | Admitting: Family Medicine

## 2012-10-29 ENCOUNTER — Encounter: Payer: Self-pay | Admitting: Family Medicine

## 2013-04-18 ENCOUNTER — Other Ambulatory Visit: Payer: Self-pay | Admitting: Family Medicine

## 2013-06-29 ENCOUNTER — Other Ambulatory Visit: Payer: Self-pay | Admitting: Family Medicine

## 2013-10-27 ENCOUNTER — Ambulatory Visit: Payer: Self-pay | Admitting: Family Medicine

## 2013-10-27 ENCOUNTER — Other Ambulatory Visit: Payer: Self-pay | Admitting: Family Medicine

## 2013-10-28 ENCOUNTER — Encounter: Payer: Self-pay | Admitting: Family Medicine

## 2013-11-21 ENCOUNTER — Other Ambulatory Visit (INDEPENDENT_AMBULATORY_CARE_PROVIDER_SITE_OTHER): Payer: BC Managed Care – PPO

## 2013-11-21 ENCOUNTER — Telehealth: Payer: Self-pay | Admitting: Family Medicine

## 2013-11-21 DIAGNOSIS — E7800 Pure hypercholesterolemia, unspecified: Secondary | ICD-10-CM

## 2013-11-21 LAB — LDL CHOLESTEROL, DIRECT: Direct LDL: 130.9 mg/dL

## 2013-11-21 LAB — COMPREHENSIVE METABOLIC PANEL
ALT: 15 U/L (ref 0–35)
Alkaline Phosphatase: 42 U/L (ref 39–117)
CO2: 31 mEq/L (ref 19–32)
Sodium: 140 mEq/L (ref 135–145)
Total Bilirubin: 0.7 mg/dL (ref 0.3–1.2)
Total Protein: 6.9 g/dL (ref 6.0–8.3)

## 2013-11-21 LAB — LIPID PANEL
Total CHOL/HDL Ratio: 3
VLDL: 10.4 mg/dL (ref 0.0–40.0)

## 2013-11-21 NOTE — Telephone Encounter (Signed)
Message copied by Excell Seltzer on Mon Nov 21, 2013  8:20 AM ------      Message from: Alvina Chou      Created: Fri Nov 11, 2013  2:55 PM      Regarding: Lab orders for Monday,12.15.14       Patient is scheduled for CPX labs, please order future labs, Thanks , Terri       ------

## 2013-11-25 ENCOUNTER — Ambulatory Visit (INDEPENDENT_AMBULATORY_CARE_PROVIDER_SITE_OTHER): Payer: BC Managed Care – PPO | Admitting: Family Medicine

## 2013-11-25 ENCOUNTER — Encounter: Payer: Self-pay | Admitting: Family Medicine

## 2013-11-25 VITALS — BP 100/74 | HR 80 | Temp 98.5°F | Ht 63.0 in | Wt 172.0 lb

## 2013-11-25 DIAGNOSIS — Z0000 Encounter for general adult medical examination without abnormal findings: Secondary | ICD-10-CM

## 2013-11-25 DIAGNOSIS — E7800 Pure hypercholesterolemia, unspecified: Secondary | ICD-10-CM

## 2013-11-25 DIAGNOSIS — Z23 Encounter for immunization: Secondary | ICD-10-CM

## 2013-11-25 DIAGNOSIS — I10 Essential (primary) hypertension: Secondary | ICD-10-CM

## 2013-11-25 MED ORDER — AZITHROMYCIN 250 MG PO TABS: ORAL_TABLET | ORAL | Status: DC

## 2013-11-25 NOTE — Patient Instructions (Addendum)
Work on regular exercise and keep up the healthy eating habits.  Continue mucinex DM but if not turning the corner in next 2-3 days.. Fill antibiotics.

## 2013-11-25 NOTE — Progress Notes (Signed)
The patient is here for annual wellness exam and preventative care.    She has had cough and congestion x 1 week, worse in last 24 hours. Productive yellow sputum.  No fever. No ear pain, no face pain, mild ST. Cough drops, mucinex DM.  No SOB.  Elevated Cholesterol: LDL at goal <130 on pravastatin Lab Results  Component Value Date   CHOL 218* 11/21/2013   HDL 78.20 11/21/2013   LDLDIRECT 130.9 11/21/2013   TRIG 52.0 11/21/2013   CHOLHDL 3 11/21/2013  Using medications without problems: none Muscle aches: None   Hypertension:   Well controlled on losartan HCTZ BP Readings from Last 3 Encounters:  11/25/13 100/74  10/01/12 128/88  06/29/12 110/70  Using medication without problems or lightheadedness: None Chest pain with exertion: none Edema:None Short of breath:None Average home BPs: not checking. Other issues:    Mood better this year. No depression, no anxiety. No exercise. Diet: moderate has been working on low carb diet. Has lost 2 lbs in last year.  Wt Readings from Last 3 Encounters:  11/25/13 172 lb (78.019 kg)  10/01/12 194 lb (87.998 kg)  06/29/12 190 lb 12 oz (86.524 kg)   Review of Systems  Constitutional: Negative for fever and fatigue.  HENT: Negative for ear pain.  Eyes: Negative for pain.  Respiratory: Negative for chest tightness and shortness of breath.  Cardiovascular: Negative for chest pain, palpitations and leg swelling.  Gastrointestinal: Negative for abdominal pain.  Genitourinary: Negative for dysuria.  Objective:   Physical Exam  Constitutional: Vital signs are normal. She appears well-developed and well-nourished. She is cooperative. Non-toxic appearance. She does not appear ill. No distress.  HENT:  Head: Normocephalic.  Right Ear: Hearing, tympanic membrane, external ear and ear canal normal.  Left Ear: Hearing, tympanic membrane, external ear and ear canal normal.  Nose: Nose normal.  Eyes: Conjunctivae normal, EOM and lids are  normal. Pupils are equal, round, and reactive to light. No foreign bodies found.  Neck: Trachea normal and normal range of motion. Neck supple. Carotid bruit is not present. No mass and no thyromegaly present.  Cardiovascular: Normal rate, regular rhythm, S1 normal, S2 normal, normal heart sounds and intact distal pulses. Exam reveals no gallop.  No murmur heard.  Pulmonary/Chest: Effort normal and breath sounds normal. No respiratory distress. She has no wheezes. She has no rhonchi. She has no rales.  Abdominal: Soft. Normal appearance and bowel sounds are normal. She exhibits no distension, no fluid wave, no abdominal bruit and no mass. There is no hepatosplenomegaly. There is no tenderness. There is no rebound, no guarding and no CVA tenderness. No hernia.  Genitourinary: Vagina normal and uterus normal. No breast swelling, tenderness, discharge or bleeding. Pelvic exam was performed with patient prone. There is no rash, tenderness or lesion on the right labia. There is no rash, tenderness or lesion on the left labia. Uterus is not enlarged and not tender.  No pap performed.  Right adnexum displays no mass, no tenderness and no fullness. Left adnexum displays no mass, no tenderness and no fullness.  Lymphadenopathy:  She has no cervical adenopathy.  She has no axillary adenopathy.  Neurological: She is alert. She has normal strength. No cranial nerve deficit or sensory deficit.  Skin: Skin is warm, dry and intact. No rash noted.  Psychiatric: Her speech is normal and behavior is normal. Judgment normal. Her mood appears not anxious. Cognition and memory are normal. She does not exhibit a depressed mood.  Assessment & Plan:   The patient's preventative maintenance and recommended screening tests for an annual wellness exam were reviewed in full today.  Brought up to date unless services declined.  Counselled on the importance of diet, exercise, and its role in overall health and mortality.  The  patient's FH and SH was reviewed, including their home life, tobacco status, and drug and alcohol status.   Vaccines: Tdap  uptodate and Flu given today. Colon: 01/2010 nml, In [facility], repeat in 5 years given father with anal cancer  Mammo: nml 10/2013 PAP/DVE: Nml pap 3 in a row, space pap to evert 2 then 3 years, yearly DVE. Nonsmoker.

## 2013-11-25 NOTE — Progress Notes (Signed)
Pre-visit discussion using our clinic review tool. No additional management support is needed unless otherwise documented below in the visit note.  

## 2014-04-29 ENCOUNTER — Other Ambulatory Visit: Payer: Self-pay | Admitting: Family Medicine

## 2014-08-03 ENCOUNTER — Other Ambulatory Visit: Payer: Self-pay | Admitting: Family Medicine

## 2014-11-04 ENCOUNTER — Other Ambulatory Visit: Payer: Self-pay | Admitting: Family Medicine

## 2014-11-05 ENCOUNTER — Other Ambulatory Visit: Payer: Self-pay | Admitting: Family Medicine

## 2014-11-06 ENCOUNTER — Ambulatory Visit: Payer: Self-pay | Admitting: Family Medicine

## 2014-11-07 ENCOUNTER — Encounter: Payer: Self-pay | Admitting: Family Medicine

## 2014-11-26 ENCOUNTER — Telehealth: Payer: Self-pay | Admitting: Family Medicine

## 2014-11-26 DIAGNOSIS — E7800 Pure hypercholesterolemia, unspecified: Secondary | ICD-10-CM

## 2014-11-26 NOTE — Telephone Encounter (Signed)
-----   Message from Alvina Chouerri J Walsh sent at 11/20/2014  3:54 PM EST ----- Regarding: Lab orders for Monday, 12.21.15 Patient is scheduled for CPX labs, please order future labs, Thanks , Camelia Engerri

## 2014-11-27 ENCOUNTER — Other Ambulatory Visit (INDEPENDENT_AMBULATORY_CARE_PROVIDER_SITE_OTHER): Payer: BC Managed Care – PPO

## 2014-11-27 DIAGNOSIS — E7800 Pure hypercholesterolemia, unspecified: Secondary | ICD-10-CM

## 2014-11-27 LAB — LIPID PANEL
CHOLESTEROL: 208 mg/dL — AB (ref 0–200)
HDL: 60.7 mg/dL (ref >39.00)
LDL CALC: 134 mg/dL — AB (ref 0–99)
NonHDL: 147.3
TRIGLYCERIDES: 69 mg/dL (ref 0.0–149.0)
Total CHOL/HDL Ratio: 3
VLDL: 13.8 mg/dL (ref 0.0–40.0)

## 2014-11-27 LAB — COMPREHENSIVE METABOLIC PANEL
ALK PHOS: 50 U/L (ref 39–117)
ALT: 19 U/L (ref 0–35)
AST: 18 U/L (ref 0–37)
Albumin: 4.4 g/dL (ref 3.5–5.2)
BUN: 18 mg/dL (ref 6–23)
CO2: 28 mEq/L (ref 19–32)
CREATININE: 0.8 mg/dL (ref 0.4–1.2)
Calcium: 9.5 mg/dL (ref 8.4–10.5)
Chloride: 103 mEq/L (ref 96–112)
GFR: 81.48 mL/min (ref >60.00)
Glucose, Bld: 99 mg/dL (ref 70–99)
Potassium: 4.2 mEq/L (ref 3.5–5.1)
Sodium: 139 mEq/L (ref 135–145)
Total Bilirubin: 0.6 mg/dL (ref 0.2–1.2)
Total Protein: 7.2 g/dL (ref 6.0–8.3)

## 2014-11-30 ENCOUNTER — Encounter: Payer: Self-pay | Admitting: Family Medicine

## 2014-11-30 ENCOUNTER — Ambulatory Visit (INDEPENDENT_AMBULATORY_CARE_PROVIDER_SITE_OTHER): Payer: BC Managed Care – PPO | Admitting: Family Medicine

## 2014-11-30 ENCOUNTER — Other Ambulatory Visit (HOSPITAL_COMMUNITY)
Admission: RE | Admit: 2014-11-30 | Discharge: 2014-11-30 | Disposition: A | Discharge status: Home / Self Care | Payer: BC Managed Care – PPO | Source: Ambulatory Visit | Attending: Family Medicine | Admitting: Family Medicine

## 2014-11-30 VITALS — BP 118/74 | HR 72 | Temp 98.2°F | Ht 63.0 in | Wt 177.5 lb

## 2014-11-30 DIAGNOSIS — M222X2 Patellofemoral disorders, left knee: Secondary | ICD-10-CM | POA: Insufficient documentation

## 2014-11-30 DIAGNOSIS — Z0000 Encounter for general adult medical examination without abnormal findings: Secondary | ICD-10-CM

## 2014-11-30 DIAGNOSIS — Z1211 Encounter for screening for malignant neoplasm of colon: Secondary | ICD-10-CM

## 2014-11-30 DIAGNOSIS — Z23 Encounter for immunization: Secondary | ICD-10-CM | POA: Diagnosis not present

## 2014-11-30 DIAGNOSIS — R42 Dizziness and giddiness: Secondary | ICD-10-CM

## 2014-11-30 DIAGNOSIS — Z01419 Encounter for gynecological examination (general) (routine) without abnormal findings: Secondary | ICD-10-CM | POA: Diagnosis present

## 2014-11-30 DIAGNOSIS — Z124 Encounter for screening for malignant neoplasm of cervix: Secondary | ICD-10-CM

## 2014-11-30 NOTE — Patient Instructions (Addendum)
Stop at front desk for referral for colonoscopy.  keep working on healthy eating and regular exercise.  For knee .Marland Kitchen. Consider glucosamine 500 mg 1-3 times a day with spells. Start home PT. Keep diary about dizzy spells. Contact me if increasing in intensity or frequency.

## 2014-11-30 NOTE — Progress Notes (Signed)
Pre visit review using our clinic review tool, if applicable. No additional management support is needed unless otherwise documented below in the visit note. 

## 2014-11-30 NOTE — Assessment & Plan Note (Signed)
Info given on home strengthening and use of glucosamine.  Follow up if not improving.

## 2014-11-30 NOTE — Progress Notes (Signed)
The patient is here for annual wellness exam and preventative care.   Pain in left knee. Only when she squats down. Some stiffness after sitting. No clicking, popping. Occ mild swelling in knee.  She has been having some pain in left knee,  No known fall or injury. No using medication for it. She is exercising more,  Doing squats jumping jacks, some running.  She has been having episodes of dizziness, feels presyncopal.  Takes deep breath and it goes away. She talks herself through it.  Not always associated with anxiety,not associated with skipping meals. No CP, no SOB.  No positional correlation.  She has been under a lot of stress at work.   Elevated Cholesterol: LDL at goal <130 on pravastatin.  Lab Results  Component Value Date   CHOL 208* 11/27/2014   HDL 60.70 11/27/2014   LDLCALC 134* 11/27/2014   LDLDIRECT 130.9 11/21/2013   TRIG 69.0 11/27/2014   CHOLHDL 3 11/27/2014  Using medications without problems: none Muscle aches: None   Hypertension:  Well controlled on losartan HCTZ  BP Readings from Last 3 Encounters:  11/30/14 118/74  11/25/13 100/74  10/01/12 128/88  Using medication without problems or lightheadedness: None Chest pain with exertion: none Edema:None Short of breath:None Average home BPs: not checking. Other issues:    Mood better this year. No depression, no anxiety. No exercise. Diet: moderate has been working on low carb diet. Has lost 2 lbs in last year.  Wt Readings from Last 3 Encounters:  11/30/14 177 lb 8 oz (80.513 kg)  11/25/13 172 lb (78.019 kg)  10/01/12 194 lb (87.998 kg)    reports that she has never smoked. She has never used smokeless tobacco. She reports that she drinks alcohol. She reports that she does not use illicit drugs.   Review of Systems  Constitutional: Negative for fever and fatigue.  HENT: Negative for ear pain.  Eyes: Negative for pain.  Respiratory: Negative for chest tightness and shortness of  breath.  Cardiovascular: Negative for chest pain, palpitations and leg swelling.  Gastrointestinal: Negative for abdominal pain.  Genitourinary: Negative for dysuria.  Objective:   Physical Exam  Constitutional: Vital signs are normal. She appears well-developed and well-nourished. She is cooperative. Non-toxic appearance. She does not appear ill. No distress.  HENT:  Head: Normocephalic.  Right Ear: Hearing, tympanic membrane, external ear and ear canal normal.  Left Ear: Hearing, tympanic membrane, external ear and ear canal normal.  Nose: Nose normal.  Eyes: Conjunctivae normal, EOM and lids are normal. Pupils are equal, round, and reactive to light. No foreign bodies found.  Neck: Trachea normal and normal range of motion. Neck supple. Carotid bruit is not present. No mass and no thyromegaly present.  Cardiovascular: Normal rate, regular rhythm, S1 normal, S2 normal, normal heart sounds and intact distal pulses. Exam reveals no gallop.  No murmur heard.  Pulmonary/Chest: Effort normal and breath sounds normal. No respiratory distress. She has no wheezes. She has no rhonchi. She has no rales.  Abdominal: Soft. Normal appearance and bowel sounds are normal. She exhibits no distension, no fluid wave, no abdominal bruit and no mass. There is no hepatosplenomegaly. There is no tenderness. There is no rebound, no guarding and no CVA tenderness. No hernia.  Genitourinary: Vagina normal and uterus normal. No breast swelling, tenderness, discharge or bleeding. Pelvic exam was performed with patient prone. There is no rash, tenderness or lesion on the right labia. There is no rash, tenderness or lesion on  the left labia. Uterus is not enlarged and not tender. PAP performed.  Right adnexum displays no mass, no tenderness and no fullness. Left adnexum displays no mass, no tenderness and no fullness.  Lymphadenopathy:  She has no cervical adenopathy.  She has no axillary adenopathy.   Neurological: She is alert. She has normal strength. No cranial nerve deficit or sensory deficit.  Skin: Skin is warm, dry and intact. No rash noted.  Psychiatric: Her speech is normal and behavior is normal. Judgment normal. Her mood appears not anxious. Cognition and memory are normal. She does not exhibit a depressed mood.   Neuro exam: No papilledema, nml cranial nerves, strength and sensation nml in upper and lower ext.  MSK: left knee, no tenderness at joint line, full ROM, no crepitus, Neg ant and posterior drawer, no swelling or redness. Assessment & Plan:   The patient's preventative maintenance and recommended screening tests for an annual wellness exam were reviewed in full today.  Brought up to date unless services declined.  Counselled on the importance of diet, exercise, and its role in overall health and mortality.  The patient's FH and SH was reviewed, including their home life, tobacco status, and drug and alcohol status.   Vaccines: Tdap uptodate and Flu  Given today.  Colon: 01/2010 nml, [place] [facility], repeat in 5 years given father with anal cancer  Mammo: nml 10/2014 PAP/DVE: Nml pap 3 in a row, space pap to evert 2 then 3 years,  (last nml 2013), yearly DVE. Nonsmoker.

## 2014-11-30 NOTE — Assessment & Plan Note (Signed)
Nml CMET, NO suggestion of orthostasis or association with possible hypoglycemia.  Nml neuro exam. If not improving, condiser further eval with TSH, cbc, B12 etc ( pt not currently fatigued).  Pt will keep diary about spells. ( currently happening only once per month.)

## 2014-12-03 ENCOUNTER — Other Ambulatory Visit: Payer: Self-pay | Admitting: Family Medicine

## 2014-12-04 LAB — CYTOLOGY - PAP

## 2014-12-05 ENCOUNTER — Encounter: Payer: Self-pay | Admitting: *Deleted

## 2015-02-01 ENCOUNTER — Other Ambulatory Visit: Payer: Self-pay | Admitting: Family Medicine

## 2015-04-27 ENCOUNTER — Encounter: Payer: Self-pay | Admitting: *Deleted

## 2015-04-27 ENCOUNTER — Ambulatory Visit
Admission: RE | Admit: 2015-04-27 | Discharge: 2015-04-27 | Disposition: A | Discharge status: Home / Self Care | Payer: BC Managed Care – PPO | Source: Ambulatory Visit | Attending: Unknown Physician Specialty | Admitting: Unknown Physician Specialty

## 2015-04-27 ENCOUNTER — Ambulatory Visit: Payer: BC Managed Care – PPO | Admitting: Anesthesiology

## 2015-04-27 ENCOUNTER — Encounter
Admission: RE | Disposition: A | Discharge status: Home / Self Care | Payer: Self-pay | Source: Ambulatory Visit | Attending: Unknown Physician Specialty

## 2015-04-27 DIAGNOSIS — K621 Rectal polyp: Secondary | ICD-10-CM | POA: Insufficient documentation

## 2015-04-27 DIAGNOSIS — Z800 Family history of malignant neoplasm of digestive organs: Secondary | ICD-10-CM | POA: Insufficient documentation

## 2015-04-27 DIAGNOSIS — Z79899 Other long term (current) drug therapy: Secondary | ICD-10-CM | POA: Diagnosis not present

## 2015-04-27 DIAGNOSIS — E785 Hyperlipidemia, unspecified: Secondary | ICD-10-CM | POA: Insufficient documentation

## 2015-04-27 DIAGNOSIS — Z1211 Encounter for screening for malignant neoplasm of colon: Secondary | ICD-10-CM | POA: Diagnosis present

## 2015-04-27 DIAGNOSIS — K219 Gastro-esophageal reflux disease without esophagitis: Secondary | ICD-10-CM | POA: Insufficient documentation

## 2015-04-27 DIAGNOSIS — I10 Essential (primary) hypertension: Secondary | ICD-10-CM | POA: Insufficient documentation

## 2015-04-27 DIAGNOSIS — K640 First degree hemorrhoids: Secondary | ICD-10-CM | POA: Diagnosis not present

## 2015-04-27 HISTORY — DX: Reserved for inherently not codable concepts without codable children: IMO0001

## 2015-04-27 HISTORY — PX: COLONOSCOPY: SHX5424

## 2015-04-27 SURGERY — COLONOSCOPY
Anesthesia: General

## 2015-04-27 MED ORDER — SODIUM CHLORIDE 0.9 % IV SOLN: INTRAVENOUS | Status: DC

## 2015-04-27 MED ORDER — LIDOCAINE HCL (CARDIAC) 20 MG/ML IV SOLN
INTRAVENOUS | Status: DC | PRN
  Administered 2015-04-27: 50 mg via INTRAVENOUS

## 2015-04-27 MED ORDER — SODIUM CHLORIDE 0.9 % IV SOLN
INTRAVENOUS | Status: DC
  Administered 2015-04-27: 08:00:00 via INTRAVENOUS

## 2015-04-27 MED ORDER — MIDAZOLAM HCL 2 MG/2ML IJ SOLN
INTRAMUSCULAR | Status: DC | PRN
  Administered 2015-04-27: 2 mg via INTRAVENOUS
  Administered 2015-04-27: 1 mg via INTRAVENOUS

## 2015-04-27 MED ORDER — PROPOFOL 10 MG/ML IV BOLUS
INTRAVENOUS | Status: DC | PRN
  Administered 2015-04-27: 40 mg via INTRAVENOUS

## 2015-04-27 MED ORDER — PROPOFOL INFUSION 10 MG/ML OPTIME
INTRAVENOUS | Status: DC | PRN
  Administered 2015-04-27: 100 ug/kg/min via INTRAVENOUS

## 2015-04-27 NOTE — Op Note (Signed)
[facility]astroenter[facility] Patient Name: Summer Hernandez Procedure Date: 04/27/2015 8:04 AM MRN: [id number] Account #: [id number] Date of Birth: [birth year] Admit Type: Outpatient Age: [age] [facility] ROOM 3 Gender: Female Note Status: Finalized Procedure:         Colonoscopy Indications:       Screening in patient at increased risk: Family history of                     1st-degree relative with colorectal cancer Providers:         Scot Junobert T. Janique Hoefer, MD Referring MD:      Excell SeltzerAmy E. Bedsole, MD (Referring MD) Medicines:         Propofol per Anesthesia Complications:     No immediate complications. Procedure:         Pre-Anesthesia Assessment:                    - After reviewing the risks and benefits, the patient was                     deemed in satisfactory condition to undergo the procedure.                    After obtaining informed consent, the colonoscope was                     passed under direct vision. Throughout the procedure, the                     patient's blood pressure, pulse, and oxygen saturations                     were monitored continuously. The Olympus PCF-H180AL                     colonoscope ( S#: [id number] ) was introduced through the                     anus and advanced to the the cecum, identified by                     appendiceal orifice and ileocecal valve. The colonoscopy                     was performed without difficulty. The patient tolerated                     the procedure well. The quality of the bowel preparation                     was excellent. Findings:      A diminutive polyp was found in the rectum. The polyp was sessile. The       polyp was removed with a jumbo cold forceps. Resection and retrieval       were complete.      Internal hemorrhoids were found during endoscopy. The hemorrhoids were       small and Grade I (internal hemorrhoids that do not prolapse).      The exam was otherwise without  abnormality. Impression:        - One diminutive polyp in the rectum. Resected and                     retrieved.                    -  Internal hemorrhoids.                    - The examination was otherwise normal. Recommendation:    - Await pathology results. Scot Junobert T Destyn Schuyler, MD 04/27/2015 8:33:08 AM This report has been signed electronically. Number of Addenda: 0 Note Initiated On: 04/27/2015 8:04 AM Scope Withdrawal Time: 0 hours 9 minutes 39 seconds  Total Procedure Duration: 0 hours 16 minutes 59 seconds       [facility]

## 2015-04-27 NOTE — H&P (Signed)
Primary Care Physician:  Kerby NoraAmy Bedsole, MD Primary Gastroenterologist:  Dr. Mechele CollinElliott  Pre-Procedure History & Physical: HPI:  Summer Hernandez is a 54 y.o. female is here for an colonoscopy.   Past Medical History  Diagnosis Date  . Hypertension   . Hyperlipidemia   . Shortness of breath dyspnea     Past Surgical History  Procedure Laterality Date  . Cesarean section    . Plantar fascitis    . Cholecystectomy, laparoscopic    . Plantar fascia surgery Right 2012    Prior to Admission medications   Medication Sig Start Date End Date Taking? Authorizing Provider  Azelaic Acid (FINACEA) 15 % cream Apply 1 application topically 2 (two) times daily. After skin is thoroughly washed and patted dry, gently but thoroughly massage a thin film of azelaic acid cream into the affected area twice daily, in the morning and evening.   Yes Historical Provider, MD  Calcium Carbonate-Vitamin D (CALCIUM-VITAMIN D) 600-200 MG-UNIT CAPS Take 1 capsule by mouth 2 (two) times daily.   Yes Historical Provider, MD  diphenhydrAMINE (SOMINEX) 25 MG tablet Take 25 mg by mouth at bedtime as needed.   Yes Historical Provider, MD  ibuprofen (ADVIL,MOTRIN) 200 MG tablet Take 400 mg by mouth every 6 (six) hours as needed for headache.   Yes Historical Provider, MD  losartan-hydrochlorothiazide (HYZAAR) 50-12.5 MG per tablet TAKE 1 TABLET BY MOUTH EVERY DAY 02/01/15  Yes Amy E Bedsole, MD  omeprazole (PRILOSEC) 20 MG capsule Take 20 mg by mouth every other day.    Yes Historical Provider, MD  pravastatin (PRAVACHOL) 20 MG tablet TAKE 1 TABLET BY MOUTH EVERY DAY 11/04/14  Yes Amy E Bedsole, MD  pravastatin (PRAVACHOL) 20 MG tablet TAKE 1 TABLET BY MOUTH EVERY DAY 12/04/14   Excell SeltzerAmy E Bedsole, MD    Allergies as of 03/23/2015  . (No Known Allergies)    Family History  Problem Relation Age of Onset  . COPD Mother   . Cancer Father     rectal and lung  . Hypertension Brother   . Heart attack Maternal Grandfather   .  Diabetes Paternal Grandmother     History   Social History  . Marital Status: Married    Spouse Name: N/A  . Number of Children: 2  . Years of Education: N/A   Occupational History  . teacher    Social History Main Topics  . Smoking status: Never Smoker   . Smokeless tobacco: Never Used  . Alcohol Use: Yes     Comment: occasional  . Drug Use: No  . Sexual Activity: Not on file   Other Topics Concern  . Not on file   Social History Narrative   Regular exercise-no    Review of Systems: See HPI, otherwise negative ROS  Physical Exam: BP 108/51 mmHg  Pulse 69  Temp(Src) 98.5 F (36.9 C) (Tympanic)  Resp 18  Ht 5\' 3"  (1.6 m)  Wt 74.844 kg (165 lb)  BMI 29.24 kg/m2  SpO2 100% General:   Alert,  pleasant and cooperative in NAD Head:  Normocephalic and atraumatic. Neck:  Supple; no masses or thyromegaly. Lungs:  Clear throughout to auscultation.    Heart:  Regular rate and rhythm. Abdomen:  Soft, nontender and nondistended. Normal bowel sounds, without guarding, and without rebound.   Neurologic:  Alert and  oriented x4;  grossly normal neurologically.  Impression/Plan: Summer Hernandez is here for an colonoscopy to be performed for screening colonoscopy  due to family history of colon cancer.  Risks, benefits, limitations, and alternatives regarding  colonoscopy have been reviewed with the patient.  Questions have been answered.  All parties agreeable.   Lynnae PrudeELLIOTT, ROBERT, MD  04/27/2015, 8:04 AM

## 2015-04-27 NOTE — Anesthesia Postprocedure Evaluation (Signed)
  Anesthesia Post-op Note  Patient: Summer Hernandez  Procedure(s) Performed: Procedure(s): COLONOSCOPY (N/A)  Anesthesia type:General  Patient location: PACU  Post pain: Pain level controlled  Post assessment: Post-op Vital signs reviewed, Patient's Cardiovascular Status Stable, Respiratory Function Stable, Patent Airway and No signs of Nausea or vomiting  Post vital signs: Reviewed and stable  Last Vitals:  Filed Vitals:   04/27/15 0837  BP: 90/61  Pulse: 74  Temp: 35.7 C  Resp: 23    Level of consciousness: awake, alert  and patient cooperative  Complications: No apparent anesthesia complications

## 2015-04-27 NOTE — Anesthesia Preprocedure Evaluation (Addendum)
Anesthesia Evaluation  Patient identified by MRN, date of birth, ID band Patient awake    Reviewed: Allergy & Precautions, NPO status , Patient's Chart, lab work & pertinent test results  Airway Mallampati: I  TM Distance: >3 FB Neck ROM: Full    Dental  (+) Teeth Intact   Pulmonary neg shortness of breath,          Cardiovascular hypertension, Pt. on medications     Neuro/Psych    GI/Hepatic GERD-  Medicated and Controlled,  Endo/Other    Renal/GU      Musculoskeletal   Abdominal   Peds  Hematology   Anesthesia Other Findings   Reproductive/Obstetrics                            Anesthesia Physical Anesthesia Plan  ASA: II  Anesthesia Plan: General   Post-op Pain Management:    Induction: Intravenous  Airway Management Planned: Nasal Cannula  Additional Equipment:   Intra-op Plan:   Post-operative Plan:   Informed Consent: I have reviewed the patients History and Physical, chart, labs and discussed the procedure including the risks, benefits and alternatives for the proposed anesthesia with the patient or authorized representative who has indicated his/her understanding and acceptance.     Plan Discussed with:   Anesthesia Plan Comments:         Anesthesia Quick Evaluation

## 2015-04-27 NOTE — Transfer of Care (Signed)
Immediate Anesthesia Transfer of Care Note  Patient: Summer Hernandez  Procedure(s) Performed: Procedure(s): COLONOSCOPY (N/A)  Patient Location: PACU  Anesthesia Type:General  Level of Consciousness: awake  Airway & Oxygen Therapy: Patient Spontanous Breathing and Patient connected to nasal cannula oxygen  Post-op Assessment: Report given to RN and Post -op Vital signs reviewed and stable  Post vital signs: Reviewed and stable  Last Vitals:  Filed Vitals:   04/27/15 0837  BP: 90/61  Pulse: 74  Temp: 35.7 C  Resp: 23    Complications: No apparent anesthesia complications

## 2015-04-27 NOTE — Anesthesia Postprocedure Evaluation (Deleted)
  Anesthesia Post-op Note  Patient: Summer Hernandez  Procedure(s) Performed: Procedure(s): COLONOSCOPY (N/A)  Anesthesia type:General  Patient location: PACU  Post pain: Pain level controlled  Post assessment: Post-op Vital signs reviewed, Patient's Cardiovascular Status Stable, Respiratory Function Stable, Patent Airway and No signs of Nausea or vomiting  Post vital signs: Reviewed and stable  Last Vitals:  Filed Vitals:   04/27/15 0837  BP: 90/61  Pulse: 74  Temp: 35.7 C  Resp: 23    Level of consciousness: awake, alert  and patient cooperative  Complications: No apparent anesthesia complications  

## 2015-04-30 LAB — SURGICAL PATHOLOGY

## 2015-05-01 ENCOUNTER — Encounter: Payer: Self-pay | Admitting: Unknown Physician Specialty

## 2015-05-15 ENCOUNTER — Ambulatory Visit (INDEPENDENT_AMBULATORY_CARE_PROVIDER_SITE_OTHER): Payer: BC Managed Care – PPO | Admitting: Family Medicine

## 2015-05-15 ENCOUNTER — Encounter: Payer: Self-pay | Admitting: Family Medicine

## 2015-05-15 VITALS — BP 100/72 | HR 69 | Temp 97.8°F | Ht 63.0 in | Wt 165.0 lb

## 2015-05-15 DIAGNOSIS — M7712 Lateral epicondylitis, left elbow: Secondary | ICD-10-CM | POA: Diagnosis not present

## 2015-05-15 NOTE — Progress Notes (Signed)
Pre visit review using our clinic review tool, if applicable. No additional management support is needed unless otherwise documented below in the visit note. 

## 2015-05-15 NOTE — Progress Notes (Signed)
Dr. Karleen HampshireSpencer T. Zerek Litsey, MD, CAQ Sports Medicine [facility] [address] [place], [id number] Phone: [phone] Fax: [phone]  05/15/2015  Patient: Summer BartersShawn W Hernandez, MRN: [id number], DOB: 1961-11-09, 54 y.o.  Primary Physician:  Kerby NoraAmy Bedsole, MD  Chief Complaint: Elbow Pain  Subjective:   Summer BartersShawn W Presas presents with lateral elbow pain.  Length of symptoms: 6-8 weeks Hand effected: L   Patient describes a dull ache on the lateral elbow. There is some translation in the proximal forearm and in the distal upper arm. It is painful to lift with the hand facing down and to lift with the thumb in an upright position. Supination is painful. Patient points to the lateral epicondyle as the point of maximal tenderness near ECRB.  No trauma.   No prior fractures or operative interventions in the effective hand. Prior PT or HEP: none  Denies numbness or tingling. No significant neck or shoulder pain.  Hand of dominance: R  Patient Active Problem List   Diagnosis Date Noted  . 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 Dizzy spells 11/30/2014  . ACNE ROSACEA 11/19/2010  . HYPERCHOLESTEROLEMIA 05/16/2010  . ESSENTIAL HYPERTENSION, BENIGN 02/04/2010  . FASCIITIS, PLANTAR 02/04/2010    Past Medical History  Diagnosis Date  . Hypertension   . Hyperlipidemia   . Shortness of breath dyspnea     Past Surgical History  Procedure Laterality Date  . Cesarean section    . Plantar fascitis    . Cholecystectomy, laparoscopic    . Plantar fascia surgery Right 2012  . Colonoscopy N/A 04/27/2015    Procedure: COLONOSCOPY;  Surgeon: Scot Junobert T Elliott, MD;  [facility];  Service: Endoscopy;  Laterality: N/A;    History   Social History  . Marital Status: Married    Spouse Name: N/A  . Number of Children: 2  . Years of Education: N/A   Occupational History  . teacher    Social History Main Topics  . Smoking status: Never Smoker     . Smokeless tobacco: Never Used  . Alcohol Use: Yes     Comment: occasional  . Drug Use: No  . Sexual Activity: Not on file   Other Topics Concern  . Not on file   Social History Narrative   Regular exercise-no    Family History  Problem Relation Age of Onset  . COPD Mother   . Cancer Father     rectal and lung  . Hypertension Brother   . Heart attack Maternal Grandfather   . Diabetes Paternal Grandmother     No Known Allergies  Medication list reviewed and updated in full in [facility].   The PMH, PSH, Social History, Family History, Medications, and allergies [facility], and have been updated if relevant.  GEN: No fevers, chills. Nontoxic. Primarily MSK c/o today. MSK: Detailed in the HPI GI: tolerating PO intake without difficulty Neuro: No numbness, parasthesias, or tingling associated. Otherwise the pertinent positives of the ROS are noted above.   Objective:   Blood pressure 100/72, pulse 69, temperature 97.8 F (36.6 C), temperature source Oral, height 5\' 3"  (1.6 m), weight 165 lb (74.844 kg).  GEN: Well-developed,well-nourished,in no acute distress; alert,appropriate and cooperative throughout examination HEENT: Normocephalic and atraumatic without obvious abnormalities. Ears, externally no deformities PULM: Breathing comfortably in no respiratory distress EXT: No clubbing, cyanosis, or edema PSYCH: Normally interactive. Cooperative during the interview. Pleasant. Friendly and conversant. Not anxious  or depressed appearing. Normal, full affect.  L elbow Ecchymosis or edema: neg ROM: full flexion, extension, pronation, supination Shoulder ROM: Full Flexion: 5/5 Extension: 5/5, PAINFUL Supination: 5/5, PAINFUL Pronation: 5/5 Wrist ext: 5/5 Wrist flexion: 5/5 No gross bony abnormality Varus and Valgus stress: stable ECRB tenderness: YES, TTP Medial epicondyle: NT Lateral epicondyle, resisted wrist extension from wrist full  pronation and flexion: PAINFUL grip: 5/5  sensation intact Tinel's, Elbow: negative  Subjective:   Lateral epicondylitis, left >25 minutes spent in face to face time with patient, >50% spent in counselling or coordination of care  Elbow anatomy was reviewed, and tendinopathy was explained.  Pt. given a formal rehab program from [facility] on elbow rehabiliation.  Series of concentric and eccentric exercises should be done starting with no weight, work up to 1 lb, hammer, etc.  Use counterforce strap if working or using hands.  Formal PT would be beneficial if sx persist Emphasized stretching an cross-friction massage Emphasized proper palms up lifting biomechanics to unload ECRB  Follow-up: prn  New Prescriptions   No medications on file   No orders of the defined types were placed in this encounter.    Signed,  Elpidio Galea. Livy Ross, MD   Patient's Medications  New Prescriptions   No medications on file  Previous Medications   AZELAIC ACID (FINACEA) 15 % CREAM    Apply 1 application topically 2 (two) times daily. After skin is thoroughly washed and patted dry, gently but thoroughly massage a thin film of azelaic acid cream into the affected area twice daily, in the morning and evening.   CALCIUM CARBONATE-VITAMIN D (CALCIUM-VITAMIN D) 600-200 MG-UNIT CAPS    Take 1 capsule by mouth 2 (two) times daily.   DIPHENHYDRAMINE (SOMINEX) 25 MG TABLET    Take 25 mg by mouth at bedtime as needed.   IBUPROFEN (ADVIL,MOTRIN) 200 MG TABLET    Take 400 mg by mouth every 6 (six) hours as needed for headache.   LOSARTAN-HYDROCHLOROTHIAZIDE (HYZAAR) 50-12.5 MG PER TABLET    TAKE 1 TABLET BY MOUTH EVERY DAY   OMEPRAZOLE (PRILOSEC) 20 MG CAPSULE    Take 20 mg by mouth every other day.    PRAVASTATIN (PRAVACHOL) 20 MG TABLET    TAKE 1 TABLET BY MOUTH EVERY DAY  Modified Medications   No medications on file  Discontinued Medications   No medications on file

## 2015-08-03 ENCOUNTER — Other Ambulatory Visit: Payer: Self-pay | Admitting: Family Medicine

## 2015-10-16 ENCOUNTER — Other Ambulatory Visit: Payer: Self-pay | Admitting: Family Medicine

## 2015-10-16 DIAGNOSIS — Z1231 Encounter for screening mammogram for malignant neoplasm of breast: Secondary | ICD-10-CM

## 2015-11-12 ENCOUNTER — Ambulatory Visit
Admission: RE | Admit: 2015-11-12 | Discharge: 2015-11-12 | Disposition: A | Discharge status: Home / Self Care | Payer: BC Managed Care – PPO | Source: Ambulatory Visit | Attending: Family Medicine | Admitting: Family Medicine

## 2015-11-12 DIAGNOSIS — Z1231 Encounter for screening mammogram for malignant neoplasm of breast: Secondary | ICD-10-CM | POA: Diagnosis present

## 2015-12-09 ENCOUNTER — Other Ambulatory Visit: Payer: Self-pay | Admitting: Family Medicine

## 2015-12-11 NOTE — Telephone Encounter (Signed)
CPE scheduled 01/08/16

## 2015-12-14 ENCOUNTER — Other Ambulatory Visit: Payer: Self-pay | Admitting: Family Medicine

## 2016-01-01 ENCOUNTER — Telehealth: Payer: Self-pay | Admitting: Family Medicine

## 2016-01-01 DIAGNOSIS — Z1159 Encounter for screening for other viral diseases: Secondary | ICD-10-CM

## 2016-01-01 DIAGNOSIS — E7800 Pure hypercholesterolemia, unspecified: Secondary | ICD-10-CM

## 2016-01-01 NOTE — Telephone Encounter (Signed)
-----   Message from Baldomero Lamy sent at 12/27/2015  1:57 PM EST ----- Regarding: Cpx labs Wed 1/25, need orders. Thanks! :-) Please order  future cpx labs for pt's upcoming lab appt. Thanks Rodney Booze

## 2016-01-02 ENCOUNTER — Other Ambulatory Visit (INDEPENDENT_AMBULATORY_CARE_PROVIDER_SITE_OTHER): Payer: BC Managed Care – PPO

## 2016-01-02 ENCOUNTER — Other Ambulatory Visit: Payer: Self-pay | Admitting: Family Medicine

## 2016-01-02 DIAGNOSIS — E7800 Pure hypercholesterolemia, unspecified: Secondary | ICD-10-CM | POA: Diagnosis not present

## 2016-01-02 DIAGNOSIS — Z1159 Encounter for screening for other viral diseases: Secondary | ICD-10-CM

## 2016-01-02 LAB — LIPID PANEL
Cholesterol: 201 mg/dL — ABNORMAL HIGH (ref 0–200)
HDL: 76.8 mg/dL (ref >39.00)
LDL CALC: 115 mg/dL — AB (ref 0–99)
NonHDL: 124.39
TRIGLYCERIDES: 45 mg/dL (ref 0.0–149.0)
Total CHOL/HDL Ratio: 3
VLDL: 9 mg/dL (ref 0.0–40.0)

## 2016-01-02 LAB — COMPREHENSIVE METABOLIC PANEL
ALBUMIN: 4.7 g/dL (ref 3.5–5.2)
ALT: 13 U/L (ref 0–35)
AST: 19 U/L (ref 0–37)
Alkaline Phosphatase: 37 U/L — ABNORMAL LOW (ref 39–117)
BUN: 18 mg/dL (ref 6–23)
CALCIUM: 9.7 mg/dL (ref 8.4–10.5)
CHLORIDE: 102 meq/L (ref 96–112)
CO2: 29 mEq/L (ref 19–32)
Creatinine, Ser: 0.94 mg/dL (ref 0.40–1.20)
GFR: 65.43 mL/min (ref >60.00)
Glucose, Bld: 90 mg/dL (ref 70–99)
POTASSIUM: 3.9 meq/L (ref 3.5–5.1)
SODIUM: 141 meq/L (ref 135–145)
Total Bilirubin: 0.8 mg/dL (ref 0.2–1.2)
Total Protein: 7.5 g/dL (ref 6.0–8.3)

## 2016-01-03 LAB — HEPATITIS C ANTIBODY: HCV Ab: NEGATIVE

## 2016-01-08 ENCOUNTER — Encounter: Payer: Self-pay | Admitting: Family Medicine

## 2016-01-08 ENCOUNTER — Ambulatory Visit (INDEPENDENT_AMBULATORY_CARE_PROVIDER_SITE_OTHER): Payer: BC Managed Care – PPO | Admitting: Family Medicine

## 2016-01-08 VITALS — BP 110/74 | HR 66 | Temp 98.1°F | Ht 62.5 in | Wt 167.8 lb

## 2016-01-08 DIAGNOSIS — Z0000 Encounter for general adult medical examination without abnormal findings: Secondary | ICD-10-CM

## 2016-01-08 DIAGNOSIS — E7800 Pure hypercholesterolemia, unspecified: Secondary | ICD-10-CM

## 2016-01-08 DIAGNOSIS — I10 Essential (primary) hypertension: Secondary | ICD-10-CM

## 2016-01-08 NOTE — Assessment & Plan Note (Signed)
Excellent improvement in control with lifestyle on pravastatin 20 mg dialy Encouraged exercise, weight loss, healthy eating habits. Will return in 6 months to re-eval to see if she can D/C the med or lower dose.

## 2016-01-08 NOTE — Assessment & Plan Note (Signed)
Encouraged exercise, weight loss, healthy eating habits. Will work on low salt diet and weight loss to try to decrease or stop the med at the 6 months follow up.

## 2016-01-08 NOTE — Progress Notes (Signed)
The patient is here for annual wellness exam and preventative care.   Doing well overall.  Elevated Cholesterol: LDL at goal <130 on pravastatin 20 mg.  Lab Results  Component Value Date   CHOL 201* 01/02/2016   HDL 76.80 01/02/2016   LDLCALC 115* 01/02/2016   LDLDIRECT 130.9 11/21/2013   TRIG 45.0 01/02/2016   CHOLHDL 3 01/02/2016   Using medications without problems: none Muscle aches: None   Hypertension:  Well controlled on losartan/HCTZ  She would like to come off her med eventually. BP Readings from Last 3 Encounters:  01/08/16 110/74  05/15/15 100/72  04/27/15 90/61  Using medication without problems or lightheadedness: None Chest pain with exertion: none Edema:None Short of breath:None Average home BPs: not checking. Other issues:  Mood good control overall. No depression, no anxiety. Exercise: 2-4 time a week, walking, in [employer]. Diet:Good, working on lifestyle change.  Wt Readings from Last 3 Encounters:  01/08/16 167 lb 12 oz (76.091 kg)  05/15/15 165 lb (74.844 kg)  04/27/15 165 lb (74.844 kg)  Body mass index is 30.17 kg/(m^2).  Reports that she has never smoked. She has never used smokeless tobacco. She reports that she drinks alcohol. She reports that she does not use illicit drugs.   Review of Systems  Constitutional: Negative for fever and fatigue.  HENT: Negative for ear pain.  Eyes: Negative for pain.  Respiratory: Negative for chest tightness and shortness of breath.  Cardiovascular: Negative for chest pain, palpitations and leg swelling.  Gastrointestinal: Negative for abdominal pain.  Genitourinary: Negative for dysuria.  Objective:   Physical Exam  Constitutional: Vital signs are normal. She appears well-developed and well-nourished. She is cooperative. Non-toxic appearance. She does not appear ill. No distress.  HENT:  Head: Normocephalic.  Right Ear: Hearing, tympanic membrane, external ear and ear canal normal.   Left Ear: Hearing, tympanic membrane, external ear and ear canal normal.  Nose: Nose normal.  Eyes: Conjunctivae normal, EOM and lids are normal. Pupils are equal, round, and reactive to light. No foreign bodies found.  Neck: Trachea normal and normal range of motion. Neck supple. Carotid bruit is not present. No mass and no thyromegaly present.  Cardiovascular: Normal rate, regular rhythm, S1 normal, S2 normal, normal heart sounds and intact distal pulses. Exam reveals no gallop.  No murmur heard.  Pulmonary/Chest: Effort normal and breath sounds normal. No respiratory distress. She has no wheezes. She has no rhonchi. She has no rales.  Abdominal: Soft. Normal appearance and bowel sounds are normal. She exhibits no distension, no fluid wave, no abdominal bruit and no mass. There is no hepatosplenomegaly. There is no tenderness. There is no rebound, no guarding and no CVA tenderness. No hernia.  Genitourinary: Vagina normal and uterus normal. No breast swelling, tenderness, discharge or bleeding. Pelvic exam was performed with patient prone. There is no rash, tenderness or lesion on the right labia. There is no rash, tenderness or lesion on the left labia. Uterus is not enlarged and not tender. PAP NOT performed.  Right adnexum displays no mass, no tenderness and no fullness. Left adnexum displays no mass, no tenderness and no fullness.  Lymphadenopathy:  She has no cervical adenopathy.  She has no axillary adenopathy.  Neurological: She is alert. She has normal strength. No cranial nerve deficit or sensory deficit.  Skin: Skin is warm, dry and intact. No rash noted.  Psychiatric: Her speech is normal and behavior is normal. Judgment normal. Her mood appears not anxious. Cognition  and memory are normal. She does not exhibit a depressed mood.   Assessment & Plan:   The patient's preventative maintenance and recommended screening tests for an annual wellness exam were reviewed in full  today.  Brought up to date unless services declined.  Counselled on the importance of diet, exercise, and its role in overall health and mortality.  The patient's FH and SH was reviewed, including their home life, tobacco status, and drug and alcohol status.   Vaccines: Tdap uptodate and Flu refused. Colon: 04/2015: polyp, Dr. Markham Jordan repeat in 5 years  father with anal cancer  Mammo: nml 11/2015 PAP/DVE: Nml pap 2015, repeat in 2018, yearly DVE Nonsmoker. HIV: refused Hep C: neg

## 2016-01-08 NOTE — Patient Instructions (Addendum)
Keep working on healthy eating and regular exercise.  Losing 10% current weight to goal of 151 can lower blood pressure.  Also lowering salt intake to 1500 mg daily can lower BP by as much as 11 points.  Consider daily 81 mg aspirin for CAD prevention.

## 2016-01-08 NOTE — Progress Notes (Signed)
Pre visit review using our clinic review tool, if applicable. No additional management support is needed unless otherwise documented below in the visit note. 

## 2016-01-27 ENCOUNTER — Other Ambulatory Visit: Payer: Self-pay | Admitting: Family Medicine

## 2016-07-01 ENCOUNTER — Other Ambulatory Visit: Payer: BC Managed Care – PPO

## 2016-07-08 ENCOUNTER — Ambulatory Visit: Payer: BC Managed Care – PPO | Admitting: Family Medicine

## 2016-10-01 ENCOUNTER — Other Ambulatory Visit: Payer: Self-pay | Admitting: Family Medicine

## 2016-10-01 DIAGNOSIS — Z1231 Encounter for screening mammogram for malignant neoplasm of breast: Secondary | ICD-10-CM

## 2016-11-12 ENCOUNTER — Ambulatory Visit
Admission: RE | Admit: 2016-11-12 | Discharge: 2016-11-12 | Disposition: A | Discharge status: Home / Self Care | Payer: BC Managed Care – PPO | Source: Ambulatory Visit | Attending: Family Medicine | Admitting: Family Medicine

## 2016-11-12 DIAGNOSIS — Z1231 Encounter for screening mammogram for malignant neoplasm of breast: Secondary | ICD-10-CM | POA: Diagnosis not present

## 2016-12-27 ENCOUNTER — Telehealth: Payer: Self-pay | Admitting: Family Medicine

## 2016-12-27 DIAGNOSIS — E7800 Pure hypercholesterolemia, unspecified: Secondary | ICD-10-CM

## 2016-12-27 NOTE — Telephone Encounter (Signed)
-----   Message from Terri J Walsh sent at 12/17/2016  6:05 PM EST ----- Regarding: Lab orders for Thursday, 1.25.18 Patient is scheduled for CPX labs, please order future labs, Thanks , Terri  

## 2016-12-29 ENCOUNTER — Other Ambulatory Visit: Payer: Self-pay | Admitting: Family Medicine

## 2017-01-01 ENCOUNTER — Other Ambulatory Visit (INDEPENDENT_AMBULATORY_CARE_PROVIDER_SITE_OTHER): Payer: BC Managed Care – PPO

## 2017-01-01 ENCOUNTER — Encounter (INDEPENDENT_AMBULATORY_CARE_PROVIDER_SITE_OTHER): Payer: Self-pay

## 2017-01-01 DIAGNOSIS — E7800 Pure hypercholesterolemia, unspecified: Secondary | ICD-10-CM | POA: Diagnosis not present

## 2017-01-01 LAB — LIPID PANEL
CHOL/HDL RATIO: 3
Cholesterol: 212 mg/dL — ABNORMAL HIGH (ref 0–200)
HDL: 81.2 mg/dL (ref >39.00)
LDL CALC: 120 mg/dL — AB (ref 0–99)
NonHDL: 130.38
TRIGLYCERIDES: 50 mg/dL (ref 0.0–149.0)
VLDL: 10 mg/dL (ref 0.0–40.0)

## 2017-01-01 LAB — COMPREHENSIVE METABOLIC PANEL
ALT: 22 U/L (ref 0–35)
AST: 24 U/L (ref 0–37)
Albumin: 4.8 g/dL (ref 3.5–5.2)
Alkaline Phosphatase: 45 U/L (ref 39–117)
BILIRUBIN TOTAL: 0.6 mg/dL (ref 0.2–1.2)
BUN: 21 mg/dL (ref 6–23)
CO2: 29 meq/L (ref 19–32)
Calcium: 9.9 mg/dL (ref 8.4–10.5)
Chloride: 103 mEq/L (ref 96–112)
Creatinine, Ser: 0.94 mg/dL (ref 0.40–1.20)
GFR: 65.2 mL/min (ref >60.00)
Glucose, Bld: 94 mg/dL (ref 70–99)
POTASSIUM: 4.4 meq/L (ref 3.5–5.1)
Sodium: 139 mEq/L (ref 135–145)
Total Protein: 7.8 g/dL (ref 6.0–8.3)

## 2017-01-02 ENCOUNTER — Other Ambulatory Visit: Payer: BC Managed Care – PPO

## 2017-01-09 ENCOUNTER — Ambulatory Visit (INDEPENDENT_AMBULATORY_CARE_PROVIDER_SITE_OTHER): Payer: BC Managed Care – PPO | Admitting: Family Medicine

## 2017-01-09 ENCOUNTER — Other Ambulatory Visit (HOSPITAL_COMMUNITY)
Admission: RE | Admit: 2017-01-09 | Discharge: 2017-01-09 | Disposition: A | Discharge status: Home / Self Care | Payer: BC Managed Care – PPO | Source: Ambulatory Visit | Attending: Family Medicine | Admitting: Family Medicine

## 2017-01-09 ENCOUNTER — Encounter: Payer: Self-pay | Admitting: Family Medicine

## 2017-01-09 VITALS — BP 100/64 | HR 62 | Temp 98.4°F | Ht 63.0 in | Wt 186.0 lb

## 2017-01-09 DIAGNOSIS — Z124 Encounter for screening for malignant neoplasm of cervix: Secondary | ICD-10-CM | POA: Diagnosis not present

## 2017-01-09 DIAGNOSIS — Z1151 Encounter for screening for human papillomavirus (HPV): Secondary | ICD-10-CM | POA: Insufficient documentation

## 2017-01-09 DIAGNOSIS — I10 Essential (primary) hypertension: Secondary | ICD-10-CM | POA: Diagnosis not present

## 2017-01-09 DIAGNOSIS — Z01419 Encounter for gynecological examination (general) (routine) without abnormal findings: Secondary | ICD-10-CM | POA: Insufficient documentation

## 2017-01-09 DIAGNOSIS — E7800 Pure hypercholesterolemia, unspecified: Secondary | ICD-10-CM | POA: Diagnosis not present

## 2017-01-09 DIAGNOSIS — Z0000 Encounter for general adult medical examination without abnormal findings: Secondary | ICD-10-CM

## 2017-01-09 NOTE — Progress Notes (Addendum)
Subjective:    Patient ID: Summer Hernandez, female    DOB: 01-17-1961, 56 y.o.   MRN: [id number]  HPI  The patient is here for annual wellness exam and preventative care.    Hypertension:    Well controlled on losartan HCTZ. BP Readings from Last 3 Encounters:  01/09/17 100/64  01/08/16 110/74  05/15/15 100/72  Using medication without problems or lightheadedness:  none Chest pain with exertion:none Edema:none Short of breath:none Average home BPs: Other issues:   Elevated Cholesterol:  On  low/moderate dose pravachol 20 mg daily: 3.6% 10 year AHA risk of CVD. Lab Results  Component Value Date   CHOL 212 (H) 01/01/2017   HDL 81.20 01/01/2017   LDLCALC 120 (H) 01/01/2017   LDLDIRECT 130.9 11/21/2013   TRIG 50.0 01/01/2017   CHOLHDL 3 01/01/2017  Using medications without problems:none Muscle aches: None Diet compliance: Healthy Exercise: Gym 3-4 days a week. Other complaints:    Social History /Family History/Past Medical History reviewed and updated if needed.  Review of Systems  Constitutional: Negative for fatigue and fever.  HENT: Negative for congestion.   Eyes: Negative for pain.  Respiratory: Negative for cough and shortness of breath.   Cardiovascular: Negative for chest pain, palpitations and leg swelling.  Gastrointestinal: Negative for abdominal pain.  Genitourinary: Negative for dysuria and vaginal bleeding.  Musculoskeletal: Positive for back pain.  Neurological: Negative for syncope, light-headedness and headaches.  Psychiatric/Behavioral: Negative for dysphoric mood.       Objective:   Physical Exam  Constitutional: Vital signs are normal. She appears well-developed and well-nourished. She is cooperative.  Non-toxic appearance. She does not appear ill. No distress.  HENT:  Head: Normocephalic.  Right Ear: Hearing, tympanic membrane, external ear and ear canal normal.  Left Ear: Hearing, tympanic membrane, external ear and ear canal normal.    Nose: Nose normal.  Eyes: Conjunctivae, EOM and lids are normal. Pupils are equal, round, and reactive to light. Lids are everted and swept, no foreign bodies found.  Neck: Trachea normal and normal range of motion. Neck supple. Carotid bruit is not present. No thyroid mass and no thyromegaly present.  Cardiovascular: Normal rate, regular rhythm, S1 normal, S2 normal, normal heart sounds and intact distal pulses.  Exam reveals no gallop.   No murmur heard. Pulmonary/Chest: Effort normal and breath sounds normal. No respiratory distress. She has no wheezes. She has no rhonchi. She has no rales.  Abdominal: Soft. Normal appearance and bowel sounds are normal. She exhibits no distension, no fluid wave, no abdominal bruit and no mass. There is no hepatosplenomegaly. There is no tenderness. There is no rebound, no guarding and no CVA tenderness. No hernia.  Genitourinary: Vagina normal and uterus normal. No breast swelling, tenderness, discharge or bleeding. Pelvic exam was performed with patient supine. There is no rash, tenderness or lesion on the right labia. There is no rash, tenderness or lesion on the left labia. Uterus is not enlarged and not tender. Cervix exhibits no motion tenderness, no discharge and no friability. Right adnexum displays no mass, no tenderness and no fullness. Left adnexum displays no mass, no tenderness and no fullness.  Lymphadenopathy:    She has no cervical adenopathy.    She has no axillary adenopathy.  Neurological: She is alert. She has normal strength. No cranial nerve deficit or sensory deficit.  Skin: Skin is warm, dry and intact. No rash noted.  Psychiatric: Her speech is normal and behavior is normal. Judgment normal. Her  mood appears not anxious. Cognition and memory are normal. She does not exhibit a depressed mood.          Assessment & Plan:  The patient's preventative maintenance and recommended screening tests for an annual wellness exam were reviewed  in full today. Brought up to date unless services declined.  Counselled on the importance of diet, exercise, and its role in overall health and mortality. The patient's FH and SH was reviewed, including their home life, tobacco status, and drug and alcohol status.   Vaccines: Tdap uptodate and Flu refused. Colon: 04/2015: polyp, Dr. Markham JordanElliot repeat in 5 years  father with anal cancer  Mammo: nml 11/2016 PAP/DVE: Nml pap 2015, no HPV, q 3 year, repeat in NOW, yearly DVE Nonsmoker. HIV: refused Hep C: neg

## 2017-01-09 NOTE — Patient Instructions (Addendum)
Follow blood pressure at home.. Call if  BP < 90/60 or if lightheaded frequently.  Keep working on healthy eating and regular exercise.

## 2017-01-09 NOTE — Assessment & Plan Note (Signed)
Excellent control. Discussed possibly reducing medication.Marland Kitchen. She will follow and call if needed.

## 2017-01-09 NOTE — Assessment & Plan Note (Signed)
Low 10 year risk on CVD on pravastatin 20 mg daily

## 2017-01-09 NOTE — Addendum Note (Signed)
Addended by: Damita LackLORING, Mayola Mcbain S on: 01/09/2017 02:41 PM   Modules accepted: Orders

## 2017-01-09 NOTE — Progress Notes (Signed)
Pre visit review using our clinic review tool, if applicable. No additional management support is needed unless otherwise documented below in the visit note. 

## 2017-01-13 LAB — CYTOLOGY - PAP
DIAGNOSIS: NEGATIVE
HPV (WINDOPATH): NOT DETECTED

## 2017-01-25 ENCOUNTER — Other Ambulatory Visit: Payer: Self-pay | Admitting: Family Medicine

## 2017-01-28 ENCOUNTER — Other Ambulatory Visit: Payer: Self-pay | Admitting: Family Medicine

## 2017-10-01 ENCOUNTER — Other Ambulatory Visit: Payer: Self-pay | Admitting: Family Medicine

## 2017-10-01 DIAGNOSIS — Z1231 Encounter for screening mammogram for malignant neoplasm of breast: Secondary | ICD-10-CM

## 2017-11-17 ENCOUNTER — Ambulatory Visit
Admission: RE | Admit: 2017-11-17 | Discharge: 2017-11-17 | Disposition: A | Discharge status: Home / Self Care | Payer: BC Managed Care – PPO | Source: Ambulatory Visit | Attending: Family Medicine | Admitting: Family Medicine

## 2017-11-17 DIAGNOSIS — Z1231 Encounter for screening mammogram for malignant neoplasm of breast: Secondary | ICD-10-CM | POA: Diagnosis present

## 2017-11-27 ENCOUNTER — Encounter: Payer: Self-pay | Admitting: Family Medicine

## 2017-11-27 ENCOUNTER — Ambulatory Visit: Payer: BC Managed Care – PPO | Admitting: Family Medicine

## 2017-11-27 VITALS — BP 110/78 | HR 84 | Temp 97.8°F | Wt 185.0 lb

## 2017-11-27 DIAGNOSIS — R42 Dizziness and giddiness: Secondary | ICD-10-CM | POA: Diagnosis not present

## 2017-11-27 DIAGNOSIS — D649 Anemia, unspecified: Secondary | ICD-10-CM

## 2017-11-27 LAB — COMPREHENSIVE METABOLIC PANEL
ALT: 15 U/L (ref 0–35)
AST: 17 U/L (ref 0–37)
Albumin: 4.5 g/dL (ref 3.5–5.2)
Alkaline Phosphatase: 49 U/L (ref 39–117)
BILIRUBIN TOTAL: 0.5 mg/dL (ref 0.2–1.2)
BUN: 17 mg/dL (ref 6–23)
CALCIUM: 9.9 mg/dL (ref 8.4–10.5)
CHLORIDE: 100 meq/L (ref 96–112)
CO2: 31 meq/L (ref 19–32)
CREATININE: 1.06 mg/dL (ref 0.40–1.20)
GFR: 56.58 mL/min — ABNORMAL LOW (ref >60.00)
GLUCOSE: 88 mg/dL (ref 70–99)
Potassium: 3.9 mEq/L (ref 3.5–5.1)
SODIUM: 138 meq/L (ref 135–145)
Total Protein: 7.6 g/dL (ref 6.0–8.3)

## 2017-11-27 LAB — CBC WITH DIFFERENTIAL/PLATELET
BASOS ABS: 0.1 10*3/uL (ref 0.0–0.1)
BASOS PCT: 0.9 % (ref 0.0–3.0)
EOS ABS: 0.1 10*3/uL (ref 0.0–0.7)
Eosinophils Relative: 1.8 % (ref 0.0–5.0)
HEMATOCRIT: 37.7 % (ref 36.0–46.0)
Hemoglobin: 12.4 g/dL (ref 12.0–15.0)
LYMPHS ABS: 2.3 10*3/uL (ref 0.7–4.0)
LYMPHS PCT: 30.4 % (ref 12.0–46.0)
MCHC: 32.8 g/dL (ref 30.0–36.0)
MCV: 90.5 fl (ref 78.0–100.0)
MONO ABS: 0.9 10*3/uL (ref 0.1–1.0)
Monocytes Relative: 12 % (ref 3.0–12.0)
NEUTROS ABS: 4.2 10*3/uL (ref 1.4–7.7)
NEUTROS PCT: 54.9 % (ref 43.0–77.0)
PLATELETS: 319 10*3/uL (ref 150.0–400.0)
RBC: 4.17 Mil/uL (ref 3.87–5.11)
RDW: 13.9 % (ref 11.5–15.5)
WBC: 7.6 10*3/uL (ref 4.0–10.5)

## 2017-11-27 LAB — TSH: TSH: 5.54 u[IU]/mL — ABNORMAL HIGH (ref 0.35–4.50)

## 2017-11-27 LAB — FERRITIN: FERRITIN: 13.7 ng/mL (ref 10.0–291.0)

## 2017-11-27 LAB — HEMOGLOBIN A1C: Hgb A1c MFr Bld: 6 % (ref 4.6–6.5)

## 2017-11-27 NOTE — Patient Instructions (Signed)
It was a pleasure to meet you today  I will notify you of lab results

## 2017-11-27 NOTE — Progress Notes (Signed)
Subjective:    Patient ID: Summer Hernandez, female    DOB: 08-28-1961, 56 y.o.   MRN: [id number]  HPI This is a 56 yo female who presents today with lightheadedness and dizziness. Last week had a singular episode of vertigo after turning around. No recent URI symptoms. Lightheadedness has been more chronic in nature with feeling like she is going to pass out. These have more frequent since vertigo. Prior to that they were very rare. Episodes have decreased over last 24 hours. When feeling light headed occasionally has some palpitations, episodes last 5-10 seconds. She has noticed this [place] which makes her nervous. Occasionally while at work. Never with positional change. Never any LOC. No headaches or visual changes. No nausea/vomiting, no chest pain/sob/swelling.  LMP several years ago. She has been denied due to low blood count. Has not followed up on this. Had colonoscopy 04/27/15.  She is an Engineer, manufacturing systemsintervention teacher for K-5.   Has gained about 20 pounds this year, stopped regular exercise and has been making unhealthy food choices. Is planning on resuming exercise soon.    Past Medical History:  Diagnosis Date  . Hyperlipidemia   . Hypertension   . Shortness of breath dyspnea    Past Surgical History:  Procedure Laterality Date  . CESAREAN SECTION    . CHOLECYSTECTOMY, LAPAROSCOPIC    . COLONOSCOPY N/A 04/27/2015   Procedure: COLONOSCOPY;  Surgeon: Scot Junobert T Elliott, MD;  [facility];  Service: Endoscopy;  Laterality: N/A;  . PLANTAR FASCIA SURGERY Right 2012  . plantar fascitis     Family History  Problem Relation Age of Onset  . COPD Mother   . Cancer Father        rectal and lung  . Hypertension Brother   . Heart attack Maternal Grandfather   . Diabetes Paternal Grandmother   . Breast cancer Maternal Aunt   . Breast cancer Paternal Aunt   . Breast cancer Maternal Aunt    Social History   Tobacco Use  . Smoking status: Never Smoker  .  Smokeless tobacco: Never Used  Substance Use Topics  . Alcohol use: Yes    Comment: occasional  . Drug use: No      Review of Systems Per HPI    Objective:   Physical Exam  Constitutional: She is oriented to person, place, and time. She appears well-nourished. No distress.  HENT:  Head: Normocephalic and atraumatic.  Right Ear: Tympanic membrane, external ear and ear canal normal.  Left Ear: Tympanic membrane, external ear and ear canal normal.  Nose: Nose normal.  Mouth/Throat: Oropharynx is clear and moist. No oropharyngeal exudate.  Eyes: Conjunctivae and EOM are normal. Pupils are equal, round, and reactive to light. Right eye exhibits no discharge. Left eye exhibits no discharge.  Neck: Normal range of motion. Neck supple. No thyromegaly present.  Cardiovascular: Normal rate, regular rhythm and normal heart sounds.  Pulmonary/Chest: Effort normal and breath sounds normal.  Musculoskeletal: Normal range of motion. She exhibits no edema.  Lymphadenopathy:    She has no cervical adenopathy.  Neurological: She is alert and oriented to person, place, and time. She has normal reflexes. No cranial nerve deficit. Coordination normal.  Skin: Skin is warm and dry. She is not diaphoretic.  Psychiatric: She has a normal mood and affect. Her behavior is normal. Judgment and thought content normal.  Vitals reviewed.     BP 110/78 (BP Location: Right Arm, Patient Position: Sitting, Cuff Size:  Normal)   Pulse 84   Temp 97.8 F (36.6 C) (Oral)   Wt 185 lb (83.9 kg)   SpO2 98%   BMI 32.77 kg/m  Wt Readings from Last 3 Encounters:  11/27/17 185 lb (83.9 kg)  01/09/17 186 lb (84.4 kg)  01/08/16 167 lb 12 oz (76.1 kg)       Assessment & Plan:  1. Lightheadedness - chronic symptom, different seeming than vertigo episode last week. No abnormal findings on PE, will check labs.  - CBC with Differential - Comprehensive metabolic panel - TSH - Ferritin  2. Anemia, unspecified  type - CBC with Differential - Ferritin - Hemoglobin A1c  3. Obesity - encouraged her to resume regular exercise, increase water intake and make healthy food choices  Olean Reeeborah Gessner, FNP-BC   [facility]  11/27/2017 2:01 PM

## 2017-11-30 ENCOUNTER — Other Ambulatory Visit: Payer: Self-pay | Admitting: Family Medicine

## 2017-11-30 MED ORDER — LEVOTHYROXINE SODIUM 50 MCG PO TABS: 50.0000 ug | ORAL_TABLET | Freq: Every day | ORAL | 3 refills | Status: DC

## 2018-01-08 ENCOUNTER — Telehealth: Payer: Self-pay | Admitting: Family Medicine

## 2018-01-08 ENCOUNTER — Other Ambulatory Visit (INDEPENDENT_AMBULATORY_CARE_PROVIDER_SITE_OTHER): Payer: BC Managed Care – PPO

## 2018-01-08 DIAGNOSIS — E7800 Pure hypercholesterolemia, unspecified: Secondary | ICD-10-CM

## 2018-01-08 DIAGNOSIS — I10 Essential (primary) hypertension: Secondary | ICD-10-CM

## 2018-01-08 LAB — COMPREHENSIVE METABOLIC PANEL
ALK PHOS: 42 U/L (ref 39–117)
ALT: 40 U/L — ABNORMAL HIGH (ref 0–35)
AST: 51 U/L — ABNORMAL HIGH (ref 0–37)
Albumin: 4.5 g/dL (ref 3.5–5.2)
BILIRUBIN TOTAL: 0.6 mg/dL (ref 0.2–1.2)
BUN: 23 mg/dL (ref 6–23)
CO2: 29 mEq/L (ref 19–32)
Calcium: 9.7 mg/dL (ref 8.4–10.5)
Chloride: 103 mEq/L (ref 96–112)
Creatinine, Ser: 0.93 mg/dL (ref 0.40–1.20)
GFR: 65.77 mL/min (ref >60.00)
GLUCOSE: 89 mg/dL (ref 70–99)
Potassium: 4.3 mEq/L (ref 3.5–5.1)
SODIUM: 141 meq/L (ref 135–145)
TOTAL PROTEIN: 7.8 g/dL (ref 6.0–8.3)

## 2018-01-08 LAB — LIPID PANEL
Cholesterol: 184 mg/dL (ref 0–200)
HDL: 82.6 mg/dL (ref >39.00)
LDL Cholesterol: 92 mg/dL (ref 0–99)
NONHDL: 101.6
Total CHOL/HDL Ratio: 2
Triglycerides: 49 mg/dL (ref 0.0–149.0)
VLDL: 9.8 mg/dL (ref 0.0–40.0)

## 2018-01-08 NOTE — Telephone Encounter (Signed)
-----   Message from Alvina Chouerri J Walsh sent at 12/31/2017  4:18 PM EST ----- Regarding: Lab orders for Friday, 2.1.19 Patient is scheduled for CPX labs, please order future labs, Thanks , Camelia Engerri

## 2018-01-15 ENCOUNTER — Encounter: Payer: Self-pay | Admitting: Family Medicine

## 2018-01-15 ENCOUNTER — Other Ambulatory Visit: Payer: Self-pay

## 2018-01-15 ENCOUNTER — Ambulatory Visit (INDEPENDENT_AMBULATORY_CARE_PROVIDER_SITE_OTHER): Payer: BC Managed Care – PPO | Admitting: Family Medicine

## 2018-01-15 ENCOUNTER — Other Ambulatory Visit: Payer: Self-pay | Admitting: *Deleted

## 2018-01-15 VITALS — BP 110/80 | HR 68 | Temp 98.4°F | Ht 63.0 in | Wt 182.0 lb

## 2018-01-15 DIAGNOSIS — E039 Hypothyroidism, unspecified: Secondary | ICD-10-CM | POA: Diagnosis not present

## 2018-01-15 DIAGNOSIS — R7989 Other specified abnormal findings of blood chemistry: Secondary | ICD-10-CM | POA: Insufficient documentation

## 2018-01-15 DIAGNOSIS — I10 Essential (primary) hypertension: Secondary | ICD-10-CM

## 2018-01-15 DIAGNOSIS — Z0000 Encounter for general adult medical examination without abnormal findings: Secondary | ICD-10-CM | POA: Diagnosis not present

## 2018-01-15 DIAGNOSIS — E7800 Pure hypercholesterolemia, unspecified: Secondary | ICD-10-CM | POA: Diagnosis not present

## 2018-01-15 DIAGNOSIS — R945 Abnormal results of liver function studies: Secondary | ICD-10-CM

## 2018-01-15 LAB — TSH: TSH: 2.84 m[IU]/L (ref 0.40–4.50)

## 2018-01-15 LAB — T3, FREE: T3 FREE: 2.8 pg/mL (ref 2.3–4.2)

## 2018-01-15 LAB — T4, FREE: FREE T4: 1.3 ng/dL (ref 0.8–1.8)

## 2018-01-15 NOTE — Patient Instructions (Addendum)
Please stop at the lab to have labs drawn.  Decrease or stop alcohol intake. Continue pravastatin for now.  If you would like return in 4-6 weeks for recheck of liver function tests.

## 2018-01-15 NOTE — Assessment & Plan Note (Signed)
No family history. Likely due to ETOH and statin. Decrease ETOH and recheck in 4-6 weeks.  Work on [employer] fat diet.  not on acetaminophen.

## 2018-01-15 NOTE — Assessment & Plan Note (Signed)
Well controlled. Continue current medication.  

## 2018-01-15 NOTE — Assessment & Plan Note (Signed)
Due for re-eval of thyroid function on new levo 50 mcg daily.

## 2018-01-15 NOTE — Assessment & Plan Note (Signed)
Great control on pravastatin

## 2018-01-15 NOTE — Progress Notes (Signed)
Subjective:    Patient ID: Summer BartersShawn W Hernandez, female    DOB: May 24, 1961, 57 y.o.   MRN: [id number]  HPI  The patient is here for annual wellness exam and preventative care.    Hypertension:   Good control on losartan HCTZ  Using medication without problems or lightheadedness:  none Chest pain with exertion: none Edema:none Short of breath:none Average home BPs: Other issues:  At last OV.Marland Kitchen. Dx with hypothyroidism: statred on low dose levothyroxine.  Needs repeat TSH to determine if now controlled.  She is feeling better. No further dizzy spells.  Wt Readings from Last 3 Encounters:  01/15/18 182 lb (82.6 kg)  11/27/17 185 lb (83.9 kg)  01/09/17 186 lb (84.4 kg)   Elevated LFTs: She drinks ETOH wine/ vodka shot drink 2-3  2-3 times a week, on statin, no tylenol.  no family history of liver issues.  High cholesterol: LDL at goal on pravastatin. No SE. Reviewed labs in detail. Lab Results  Component Value Date   CHOL 184 01/08/2018   HDL 82.60 01/08/2018   LDLCALC 92 01/08/2018   LDLDIRECT 130.9 11/21/2013   TRIG 49.0 01/08/2018   CHOLHDL 2 01/08/2018   Healthy diet, now getting exercise 3 days a week.   Social History /Family History/Past Medical History reviewed in detail and updated in EMR if needed. Blood pressure 110/80, pulse 68, temperature 98.4 F (36.9 C), temperature source Oral, height 5\' 3"  (1.6 m), weight 182 lb (82.6 kg).   Review of Systems  Constitutional: Negative for fatigue and fever.  HENT: Negative for congestion.   Eyes: Negative for pain.  Respiratory: Negative for cough and shortness of breath.   Cardiovascular: Negative for chest pain, palpitations and leg swelling.  Gastrointestinal: Negative for abdominal pain.  Genitourinary: Negative for dysuria and vaginal bleeding.  Musculoskeletal: Negative for back pain.  Neurological: Negative for syncope, light-headedness and headaches.  Psychiatric/Behavioral: Negative for dysphoric mood.         Objective:   Physical Exam  Constitutional: Vital signs are normal. She appears well-developed and well-nourished. She is cooperative.  Non-toxic appearance. She does not appear ill. No distress.  HENT:  Head: Normocephalic.  Right Ear: Hearing, tympanic membrane, external ear and ear canal normal.  Left Ear: Hearing, tympanic membrane, external ear and ear canal normal.  Nose: Nose normal.  Eyes: Conjunctivae, EOM and lids are normal. Pupils are equal, round, and reactive to light. Lids are everted and swept, no foreign bodies found.  Neck: Trachea normal and normal range of motion. Neck supple. Carotid bruit is not present. No thyroid mass and no thyromegaly present.  Cardiovascular: Normal rate, regular rhythm, S1 normal, S2 normal, normal heart sounds and intact distal pulses. Exam reveals no gallop.  No murmur heard. Pulmonary/Chest: Effort normal and breath sounds normal. No respiratory distress. She has no wheezes. She has no rhonchi. She has no rales.  Abdominal: Soft. Normal appearance and bowel sounds are normal. She exhibits no distension, no fluid wave, no abdominal bruit and no mass. There is no hepatosplenomegaly. There is no tenderness. There is no rebound, no guarding and no CVA tenderness. No hernia.  Lymphadenopathy:    She has no cervical adenopathy.    She has no axillary adenopathy.  Neurological: She is alert. She has normal strength. No cranial nerve deficit or sensory deficit.  Skin: Skin is warm, dry and intact. No rash noted.  Psychiatric: Her speech is normal and behavior is normal. Judgment normal. Her mood appears not  anxious. Cognition and memory are normal. She does not exhibit a depressed mood.          Assessment & Plan:  The patient's preventative maintenance and recommended screening tests for an annual wellness exam were reviewed in full today. Brought up to date unless services declined.  Counselled on the importance of diet, exercise, and its role  in overall health and mortality. The patient's FH and SH was reviewed, including their home life, tobacco status, and drug and alcohol status.   Vaccines: Tdap, flu uptodate  Colon: 04/2015: polyp, Dr. Markham Jordan repeat in 5 years father with anal cancer  Mammo: nml 11/2017 PAP/DVE: Nml pap 2018, no HPV, q 5 year, repeat in NOW, yearly DVE Nonsmoker. HIV: refused Hep C: neg

## 2018-01-18 ENCOUNTER — Other Ambulatory Visit: Payer: Self-pay | Admitting: *Deleted

## 2018-01-18 MED ORDER — LOSARTAN POTASSIUM-HCTZ 50-12.5 MG PO TABS: 1.0000 | ORAL_TABLET | Freq: Every day | ORAL | 3 refills | Status: DC

## 2018-01-18 MED ORDER — PRAVASTATIN SODIUM 20 MG PO TABS: 20.0000 mg | ORAL_TABLET | Freq: Every day | ORAL | 3 refills | Status: DC

## 2018-03-20 ENCOUNTER — Other Ambulatory Visit: Payer: Self-pay | Admitting: Family Medicine

## 2018-10-04 ENCOUNTER — Other Ambulatory Visit: Payer: Self-pay | Admitting: Family Medicine

## 2018-10-04 DIAGNOSIS — Z1231 Encounter for screening mammogram for malignant neoplasm of breast: Secondary | ICD-10-CM

## 2018-11-22 ENCOUNTER — Ambulatory Visit
Admission: RE | Admit: 2018-11-22 | Discharge: 2018-11-22 | Disposition: A | Discharge status: Home / Self Care | Payer: BC Managed Care – PPO | Source: Ambulatory Visit | Attending: Family Medicine | Admitting: Family Medicine

## 2018-11-22 DIAGNOSIS — Z1231 Encounter for screening mammogram for malignant neoplasm of breast: Secondary | ICD-10-CM | POA: Insufficient documentation

## 2019-01-12 ENCOUNTER — Telehealth: Payer: Self-pay | Admitting: Family Medicine

## 2019-01-12 DIAGNOSIS — E7800 Pure hypercholesterolemia, unspecified: Secondary | ICD-10-CM

## 2019-01-12 DIAGNOSIS — R7989 Other specified abnormal findings of blood chemistry: Secondary | ICD-10-CM

## 2019-01-12 DIAGNOSIS — R945 Abnormal results of liver function studies: Secondary | ICD-10-CM

## 2019-01-12 DIAGNOSIS — E039 Hypothyroidism, unspecified: Secondary | ICD-10-CM

## 2019-01-12 NOTE — Telephone Encounter (Signed)
-----   Message from Wendi Maya, RT sent at 01/04/2019 12:44 PM EST ----- Regarding: Lab orders for Thursday 01/13/19 Please enter CPE lab orders for 01/13/19. Thanks!

## 2019-01-13 ENCOUNTER — Other Ambulatory Visit (INDEPENDENT_AMBULATORY_CARE_PROVIDER_SITE_OTHER): Payer: BC Managed Care – PPO

## 2019-01-13 DIAGNOSIS — E039 Hypothyroidism, unspecified: Secondary | ICD-10-CM | POA: Diagnosis not present

## 2019-01-13 DIAGNOSIS — E7800 Pure hypercholesterolemia, unspecified: Secondary | ICD-10-CM

## 2019-01-13 DIAGNOSIS — R945 Abnormal results of liver function studies: Secondary | ICD-10-CM | POA: Diagnosis not present

## 2019-01-13 DIAGNOSIS — R7989 Other specified abnormal findings of blood chemistry: Secondary | ICD-10-CM

## 2019-01-13 LAB — COMPREHENSIVE METABOLIC PANEL
ALT: 16 U/L (ref 0–35)
AST: 18 U/L (ref 0–37)
Albumin: 4.5 g/dL (ref 3.5–5.2)
Alkaline Phosphatase: 41 U/L (ref 39–117)
BUN: 16 mg/dL (ref 6–23)
CALCIUM: 9.9 mg/dL (ref 8.4–10.5)
CHLORIDE: 104 meq/L (ref 96–112)
CO2: 30 meq/L (ref 19–32)
CREATININE: 0.91 mg/dL (ref 0.40–1.20)
GFR: 63.23 mL/min (ref >60.00)
GLUCOSE: 98 mg/dL (ref 70–99)
Potassium: 4.6 mEq/L (ref 3.5–5.1)
SODIUM: 140 meq/L (ref 135–145)
Total Bilirubin: 0.7 mg/dL (ref 0.2–1.2)
Total Protein: 7.2 g/dL (ref 6.0–8.3)

## 2019-01-13 LAB — LIPID PANEL
CHOL/HDL RATIO: 3
Cholesterol: 219 mg/dL — ABNORMAL HIGH (ref 0–200)
HDL: 70.7 mg/dL (ref >39.00)
LDL CALC: 128 mg/dL — AB (ref 0–99)
NONHDL: 148.35
TRIGLYCERIDES: 100 mg/dL (ref 0.0–149.0)
VLDL: 20 mg/dL (ref 0.0–40.0)

## 2019-01-13 LAB — T4, FREE: Free T4: 0.91 ng/dL (ref 0.60–1.60)

## 2019-01-13 LAB — T3, FREE: T3, Free: 3.1 pg/mL (ref 2.3–4.2)

## 2019-01-13 LAB — TSH: TSH: 3.65 u[IU]/mL (ref 0.35–4.50)

## 2019-01-17 ENCOUNTER — Telehealth: Payer: Self-pay | Admitting: Family Medicine

## 2019-01-17 NOTE — Telephone Encounter (Signed)
Left message asking pt to call office  °Please r/s 2/11 appointment with dr bedsole °

## 2019-01-18 ENCOUNTER — Encounter: Payer: BC Managed Care – PPO | Admitting: Family Medicine

## 2019-01-20 ENCOUNTER — Encounter: Payer: Self-pay | Admitting: Family Medicine

## 2019-01-20 ENCOUNTER — Ambulatory Visit (INDEPENDENT_AMBULATORY_CARE_PROVIDER_SITE_OTHER): Payer: BC Managed Care – PPO | Admitting: Family Medicine

## 2019-01-20 VITALS — BP 120/80 | HR 75 | Temp 98.0°F | Ht 62.5 in | Wt 182.2 lb

## 2019-01-20 DIAGNOSIS — I10 Essential (primary) hypertension: Secondary | ICD-10-CM

## 2019-01-20 DIAGNOSIS — E039 Hypothyroidism, unspecified: Secondary | ICD-10-CM

## 2019-01-20 DIAGNOSIS — M25562 Pain in left knee: Secondary | ICD-10-CM

## 2019-01-20 DIAGNOSIS — E7800 Pure hypercholesterolemia, unspecified: Secondary | ICD-10-CM | POA: Diagnosis not present

## 2019-01-20 DIAGNOSIS — Z0000 Encounter for general adult medical examination without abnormal findings: Secondary | ICD-10-CM | POA: Diagnosis not present

## 2019-01-20 DIAGNOSIS — R945 Abnormal results of liver function studies: Secondary | ICD-10-CM | POA: Diagnosis not present

## 2019-01-20 DIAGNOSIS — M25512 Pain in left shoulder: Secondary | ICD-10-CM | POA: Insufficient documentation

## 2019-01-20 DIAGNOSIS — R7989 Other specified abnormal findings of blood chemistry: Secondary | ICD-10-CM

## 2019-01-20 DIAGNOSIS — M25561 Pain in right knee: Secondary | ICD-10-CM | POA: Insufficient documentation

## 2019-01-20 DIAGNOSIS — M7918 Myalgia, other site: Secondary | ICD-10-CM

## 2019-01-20 NOTE — Assessment & Plan Note (Signed)
Resolved

## 2019-01-20 NOTE — Assessment & Plan Note (Signed)
Pt noted after flu shot 5 months ago. May have caused bursitis .. treat with NSAIDs.

## 2019-01-20 NOTE — Assessment & Plan Note (Signed)
Worsened control get back on track with low chol diet.

## 2019-01-20 NOTE — Patient Instructions (Addendum)
Can use glucosamine 500 mg 1-3 times daily as need for joint heath.  Keep with exercise and get back on track with healthy low cholesterol diet changes. Call if you want to recheck cholesterol in 3-6 months. Low impact exercsie 3-5 times weekly.  Ice knee.  Start ibuprofen 800 mg  3 times daily  X 2-3 day regularly then as needed.

## 2019-01-20 NOTE — Assessment & Plan Note (Signed)
Well controlled. Continue current medication.  

## 2019-01-20 NOTE — Progress Notes (Signed)
Subjective:    Patient ID: Summer Hernandez, female    DOB: 25-Jul-1961, 58 y.o.   MRN: [id number]  HPI The patient is here for annual wellness exam and preventative care.    Left knee pain: off and on  X few months, not worsening. No known injury . Does do aerobic 2-3 times week.  no swelling or redness.  No medication taken.  Hypertension:   Good control on losartan HCTZ  Using medication without problems or lightheadedness: none Chest pain with exertion:none Edema:none Short of breath:none Average home BPs: Other issues:   hypothyroidism  Stable on current dose of levothyroxine Lab Results  Component Value Date   TSH 3.65 01/13/2019   Elevated LFTs:  Resolved on recent labs.  Elevated Cholesterol:  LDL 40 points higher in last year She has not been eating as well as last year. Lab Results  Component Value Date   CHOL 219 (H) 01/13/2019   HDL 70.70 01/13/2019   LDLCALC 128 (H) 01/13/2019   LDLDIRECT 130.9 11/21/2013   TRIG 100.0 01/13/2019   CHOLHDL 3 01/13/2019  Using medications without problems: Muscle aches:  Diet compliance: poor Exercise: Aerobics class Other complaints:  Social History /Family History/Past Medical History reviewed in detail and updated in EMR if needed. Blood pressure 120/80, pulse 75, temperature 98 F (36.7 C), temperature source Oral, height 5' 2.5" (1.588 m), weight 182 lb 4 oz (82.7 kg), SpO2 97 %.  Review of Systems  Constitutional: Negative for fatigue and fever.  HENT: Negative for congestion.   Eyes: Negative for pain.  Respiratory: Negative for cough and shortness of breath.   Cardiovascular: Negative for chest pain, palpitations and leg swelling.  Gastrointestinal: Negative for abdominal pain.  Genitourinary: Negative for dysuria and vaginal bleeding.  Musculoskeletal: Negative for back pain.  Neurological: Negative for syncope, light-headedness and headaches.  Psychiatric/Behavioral: Negative for dysphoric mood.         Objective:   Physical Exam Constitutional:      General: She is not in acute distress.    Appearance: Normal appearance. She is well-developed. She is not ill-appearing or toxic-appearing.  HENT:     Head: Normocephalic.     Right Ear: Hearing, tympanic membrane, ear canal and external ear normal.     Left Ear: Hearing, tympanic membrane, ear canal and external ear normal.     Nose: Nose normal.  Eyes:     General: Lids are normal. Lids are everted, no foreign bodies appreciated.     Conjunctiva/sclera: Conjunctivae normal.     Pupils: Pupils are equal, round, and reactive to light.  Neck:     Musculoskeletal: Normal range of motion and neck supple.     Thyroid: No thyroid mass or thyromegaly.     Vascular: No carotid bruit.     Trachea: Trachea normal.  Cardiovascular:     Rate and Rhythm: Normal rate and regular rhythm.     Heart sounds: Normal heart sounds, S1 normal and S2 normal. No murmur. No gallop.   Pulmonary:     Effort: Pulmonary effort is normal. No respiratory distress.     Breath sounds: Normal breath sounds. No wheezing, rhonchi or rales.  Abdominal:     General: Bowel sounds are normal. There is no distension or abdominal bruit.     Palpations: Abdomen is soft. There is no fluid wave or mass.     Tenderness: There is no abdominal tenderness. There is no guarding or rebound.  Hernia: No hernia is present.  Musculoskeletal:     Left shoulder: She exhibits tenderness. She exhibits normal range of motion.     Left knee: She exhibits normal range of motion, no swelling, normal patellar mobility, no bony tenderness, normal meniscus and no MCL laxity. No tenderness found. No medial joint line, no lateral joint line, no MCL, no LCL and no patellar tendon tenderness noted.     Comments: ttp over deltoid and at bursa.  Lymphadenopathy:     Cervical: No cervical adenopathy.  Skin:    General: Skin is warm and dry.     Findings: No rash.  Neurological:     Mental  Status: She is alert.     Cranial Nerves: No cranial nerve deficit.     Sensory: No sensory deficit.  Psychiatric:        Mood and Affect: Mood is not anxious or depressed.        Speech: Speech normal.        Behavior: Behavior normal. Behavior is cooperative.        Judgment: Judgment normal.           Assessment & Plan:  The patient's preventative maintenance and recommended screening tests for an annual wellness exam were reviewed in full today. Brought up to date unless services declined.  Counselled on the importance of diet, exercise, and its role in overall health and mortality. The patient's FH and SH was reviewed, including their home life, tobacco status, and drug and alcohol status.   Vaccines: Tdap, flu uptodate  Colon: 04/2015: polyp, Dr. Markham Jordan repeat in 5 years father with anal cancer  Mammo: nml 11/2018 PAP/DVE: Nml pap 2018,no HPV, q 5 year,  No family history of ovarian cancer Nonsmoker. HIV: refused Hep C: neg

## 2019-01-20 NOTE — Assessment & Plan Note (Signed)
Likely OA.. no sign of acute injury. Treat with NSAIDs, ice, change to low impact exercise and  Can consider glucosamine use.

## 2019-01-31 ENCOUNTER — Other Ambulatory Visit: Payer: Self-pay | Admitting: Family Medicine

## 2019-03-31 ENCOUNTER — Other Ambulatory Visit: Payer: Self-pay | Admitting: Family Medicine

## 2019-06-11 ENCOUNTER — Encounter: Payer: Self-pay | Admitting: Emergency Medicine

## 2019-06-11 ENCOUNTER — Other Ambulatory Visit: Payer: Self-pay

## 2019-06-11 ENCOUNTER — Emergency Department
Admission: EM | Admit: 2019-06-11 | Discharge: 2019-06-11 | Disposition: A | Discharge status: Home / Self Care | Payer: BC Managed Care – PPO | Attending: Emergency Medicine | Admitting: Emergency Medicine

## 2019-06-11 DIAGNOSIS — Z79899 Other long term (current) drug therapy: Secondary | ICD-10-CM | POA: Diagnosis not present

## 2019-06-11 DIAGNOSIS — I10 Essential (primary) hypertension: Secondary | ICD-10-CM | POA: Diagnosis not present

## 2019-06-11 DIAGNOSIS — Z7982 Long term (current) use of aspirin: Secondary | ICD-10-CM | POA: Diagnosis not present

## 2019-06-11 DIAGNOSIS — E039 Hypothyroidism, unspecified: Secondary | ICD-10-CM | POA: Diagnosis not present

## 2019-06-11 DIAGNOSIS — L03116 Cellulitis of left lower limb: Secondary | ICD-10-CM | POA: Insufficient documentation

## 2019-06-11 MED ORDER — SULFAMETHOXAZOLE-TRIMETHOPRIM 800-160 MG PO TABS
1.0000 | ORAL_TABLET | Freq: Once | ORAL | Status: AC
  Administered 2019-06-11: 23:00:00 1 via ORAL
  Filled 2019-06-11: qty 1

## 2019-06-11 MED ORDER — CEPHALEXIN 500 MG PO CAPS
500.0000 mg | ORAL_CAPSULE | Freq: Once | ORAL | Status: AC
  Administered 2019-06-11: 23:00:00 500 mg via ORAL
  Filled 2019-06-11: qty 1

## 2019-06-11 MED ORDER — CEPHALEXIN 500 MG PO CAPS: 500.0000 mg | ORAL_CAPSULE | Freq: Three times a day (TID) | ORAL | 0 refills | Status: AC

## 2019-06-11 MED ORDER — SULFAMETHOXAZOLE-TRIMETHOPRIM 800-160 MG PO TABS: 1.0000 | ORAL_TABLET | Freq: Two times a day (BID) | ORAL | 0 refills | Status: AC

## 2019-06-11 NOTE — ED Triage Notes (Signed)
Patient states that she was stung by a wasp yesterday in her left groin. Patient states that the redness and swelling have increased today.

## 2019-06-11 NOTE — ED Provider Notes (Signed)
[facility] Emergency Department Provider Note  ____________________________________________  Time seen: Approximately 11:16 PM  I have reviewed the triage vital signs and the nursing notes.   HISTORY  Chief Complaint Insect Bite    HPI Summer Hernandez is a 58 y.o. female presents to the emergency department with cellulitis of the left inner thigh that became apparent 2 days ago after patient was stung by a wasp.  Patient states that she noticed immediate redness and swelling surrounding insect bite.  Patient states that she has been using topical hydrocortisone and states that redness has worsened.  Patient currently has approximately 12 cm of circumferential cellulitis.  Patient denies fever or chills at home.  No history of diabetes or immunosuppression.  No other alleviating measures have been attempted.        Past Medical History:  Diagnosis Date  . Hyperlipidemia   . Hypertension   . Shortness of breath dyspnea     Patient Active Problem List   Diagnosis Date Noted  . Pain of left deltoid 01/20/2019  . Acute pain of left knee 01/20/2019  . Elevated LFTs 01/15/2018  . Hypothyroidism 01/15/2018  . ACNE ROSACEA 11/19/2010  . HYPERCHOLESTEROLEMIA 05/16/2010  . ESSENTIAL HYPERTENSION, BENIGN 02/04/2010    Past Surgical History:  Procedure Laterality Date  . CESAREAN SECTION    . CHOLECYSTECTOMY, LAPAROSCOPIC    . COLONOSCOPY N/A 04/27/2015   Procedure: COLONOSCOPY;  Surgeon: Scot Junobert T Elliott, MD;  [facility];  Service: Endoscopy;  Laterality: N/A;  . PLANTAR FASCIA SURGERY Right 2012  . plantar fascitis      Prior to Admission medications   Medication Sig Start Date End Date Taking? Authorizing Provider  aspirin (BAYER ASPIRIN EC LOW DOSE) 81 MG EC tablet     [provider]  Azelaic Acid (FINACEA) 15 % cream Apply 1 application topically 2 (two) times daily. After skin is thoroughly washed and patted dry, gently but  thoroughly massage a thin film of azelaic acid cream into the affected area twice daily, in the morning and evening.    [provider]  Calcium Carbonate-Vitamin D (CALCIUM-VITAMIN D) 600-200 MG-UNIT CAPS Take 1 capsule by mouth 2 (two) times daily.    [provider]  cephALEXin (KEFLEX) 500 MG capsule Take 1 capsule (500 mg total) by mouth 3 (three) times daily for 7 days. 06/11/19 06/18/19  Orvil FeilWoods, Jaclyn M, PA-C  diphenhydrAMINE (SOMINEX) 25 MG tablet Take 25 mg by mouth at bedtime as needed.    [provider]  levothyroxine (SYNTHROID) 50 MCG tablet TAKE 1 TABLET BY MOUTH EVERY DAY 03/31/19   Bedsole, Amy E, MD  losartan-hydrochlorothiazide (HYZAAR) 50-12.5 MG tablet TAKE 1 TABLET BY MOUTH EVERY DAY 01/31/19   Copland, Karleen HampshireSpencer, MD  omeprazole (PRILOSEC) 20 MG capsule Take 20 mg by mouth every other day.     [provider]  pravastatin (PRAVACHOL) 20 MG tablet TAKE 1 TABLET BY MOUTH EVERY DAY 01/31/19   Copland, Karleen HampshireSpencer, MD  sulfamethoxazole-trimethoprim (BACTRIM DS) 800-160 MG tablet Take 1 tablet by mouth 2 (two) times daily for 7 days. 06/11/19 06/18/19  Orvil FeilWoods, Jaclyn M, PA-C    Allergies Patient has no known allergies.  Family History  Problem Relation Age of Onset  . COPD Mother   . Cancer Father        rectal and lung  . Hypertension Brother   . Heart attack Maternal Grandfather   . Diabetes Paternal Grandmother   . Breast cancer Maternal  Aunt   . Breast cancer Paternal Aunt   . Breast cancer Maternal Aunt     Social History Social History   Tobacco Use  . Smoking status: Never Smoker  . Smokeless tobacco: Never Used  Substance Use Topics  . Alcohol use: Yes    Comment: occasional  . Drug use: No     Review of Systems  Constitutional: No fever/chills Eyes: No visual changes. No discharge ENT: No upper respiratory complaints. Cardiovascular: no chest pain. Respiratory: no cough. No SOB. Gastrointestinal: No abdominal pain.  No  nausea, no vomiting.  No diarrhea.  No constipation. Musculoskeletal: Negative for musculoskeletal pain. Skin: Patient has left thigh cellulitis.  Neurological: Negative for headaches, focal weakness or numbness.   ____________________________________________   PHYSICAL EXAM:  VITAL SIGNS: ED Triage Vitals [06/11/19 2111]  Enc Vitals Group     BP 131/64     Pulse Rate 94     Resp 18     Temp 98.4 F (36.9 C)     Temp Source Oral     SpO2 97 %     Weight 180 lb (81.6 kg)     Height 5\' 3"  (1.6 m)     Head Circumference      Peak Flow      Pain Score 3     Pain Loc      Pain Edu?      Excl. in GC?      Constitutional: Alert and oriented. Well appearing and in no acute distress. Eyes: Conjunctivae are normal. PERRL. EOMI. Head: Atraumatic. Cardiovascular: Normal rate, regular rhythm. Normal S1 and S2.  Good peripheral circulation. Respiratory: Normal respiratory effort without tachypnea or retractions. Lungs CTAB. Good air entry to the bases with no decreased or absent breath sounds. Musculoskeletal: Full range of motion to all extremities. No gross deformities appreciated. Neurologic:  Normal speech and language. No gross focal neurologic deficits are appreciated.  Skin: Patient has 12 cm of circumferential cellulitis at left thigh. Psychiatric: Mood and affect are normal. Speech and behavior are normal. Patient exhibits appropriate insight and judgement.   ____________________________________________   LABS (all labs ordered are listed, but only abnormal results are displayed)  Labs Reviewed - No data to display ____________________________________________  EKG   ____________________________________________  RADIOLOGY   No results found.  ____________________________________________    PROCEDURES  Procedure(s) performed:    Procedures    Medications  sulfamethoxazole-trimethoprim (BACTRIM DS) 800-160 MG per tablet 1 tablet (has no  administration in time range)  cephALEXin (KEFLEX) capsule 500 mg (has no administration in time range)     ____________________________________________   INITIAL IMPRESSION / ASSESSMENT AND PLAN / ED COURSE  Pertinent labs & imaging results that were available during my care of the patient were reviewed by me and considered in my medical decision making (see chart for details).  Review of the [place] CSRS was performed in accordance of the [facility] prior to dispensing any controlled drugs.           Assessment and plan Cellulitis Patient presents to the emergency department with 12 cm of cellulitis at left thigh.  Patient does not have any palpable induration or fluctuance that would suggest underlying abscess.  No fever or chills at home.  No history of immunosuppression or diabetes.  Patient was started on Keflex and Bactrim.  Patient was given strict return precautions to return to the emergency department if cellulitis extends demarcation of cellulitis made in emergency department.  She voiced  understanding.  All patient questions were answered.    ____________________________________________  FINAL CLINICAL IMPRESSION(S) / ED DIAGNOSES  Final diagnoses:  Cellulitis of left lower extremity      NEW MEDICATIONS STARTED DURING THIS VISIT:  ED Discharge Orders         Ordered    sulfamethoxazole-trimethoprim (BACTRIM DS) 800-160 MG tablet  2 times daily     06/11/19 2311    cephALEXin (KEFLEX) 500 MG capsule  3 times daily     06/11/19 2311              This chart was dictated using voice recognition software/Dragon. Despite best efforts to proofread, errors can occur which can change the meaning. Any change was purely unintentional.    Lannie Fields, PA-C 06/11/19 2320    Nance Pear, MD 06/12/19 [phone]

## 2019-06-11 NOTE — Discharge Instructions (Signed)
You have been diagnosed with cellulitis of the left lower extremity. If redness extends beyond pen demarcation, please return to the emergency department. Please take Keflex 3 times a day for the next 7 days. Please take Bactrim twice daily for the next 7 days

## 2019-10-10 ENCOUNTER — Other Ambulatory Visit: Payer: Self-pay | Admitting: Family Medicine

## 2019-10-10 DIAGNOSIS — Z1231 Encounter for screening mammogram for malignant neoplasm of breast: Secondary | ICD-10-CM

## 2019-11-24 ENCOUNTER — Ambulatory Visit
Admission: RE | Admit: 2019-11-24 | Discharge: 2019-11-24 | Disposition: A | Discharge status: Home / Self Care | Payer: BC Managed Care – PPO | Source: Ambulatory Visit | Attending: Family Medicine | Admitting: Family Medicine

## 2019-11-24 DIAGNOSIS — Z1231 Encounter for screening mammogram for malignant neoplasm of breast: Secondary | ICD-10-CM | POA: Insufficient documentation

## 2020-01-18 ENCOUNTER — Telehealth: Payer: Self-pay | Admitting: Family Medicine

## 2020-01-18 DIAGNOSIS — E039 Hypothyroidism, unspecified: Secondary | ICD-10-CM

## 2020-01-18 DIAGNOSIS — E7800 Pure hypercholesterolemia, unspecified: Secondary | ICD-10-CM

## 2020-01-18 NOTE — Telephone Encounter (Signed)
-----   Message from Alvina Chou sent at 01/06/2020 12:07 PM EST ----- Regarding: Lab orders for Thursday, 2.11.21 Patient is scheduled for CPX labs, please order future labs, Thanks , Camelia Eng

## 2020-01-19 ENCOUNTER — Other Ambulatory Visit: Payer: Self-pay

## 2020-01-19 ENCOUNTER — Other Ambulatory Visit (INDEPENDENT_AMBULATORY_CARE_PROVIDER_SITE_OTHER): Payer: BC Managed Care – PPO

## 2020-01-19 DIAGNOSIS — E7800 Pure hypercholesterolemia, unspecified: Secondary | ICD-10-CM | POA: Diagnosis not present

## 2020-01-19 DIAGNOSIS — E039 Hypothyroidism, unspecified: Secondary | ICD-10-CM

## 2020-01-19 LAB — COMPREHENSIVE METABOLIC PANEL
ALT: 13 U/L (ref 0–35)
AST: 17 U/L (ref 0–37)
Albumin: 4.5 g/dL (ref 3.5–5.2)
Alkaline Phosphatase: 50 U/L (ref 39–117)
BUN: 17 mg/dL (ref 6–23)
CO2: 30 mEq/L (ref 19–32)
Calcium: 10 mg/dL (ref 8.4–10.5)
Chloride: 103 mEq/L (ref 96–112)
Creatinine, Ser: 0.95 mg/dL (ref 0.40–1.20)
GFR: 59.96 mL/min — ABNORMAL LOW (ref >60.00)
Glucose, Bld: 101 mg/dL — ABNORMAL HIGH (ref 70–99)
Potassium: 4.6 mEq/L (ref 3.5–5.1)
Sodium: 140 mEq/L (ref 135–145)
Total Bilirubin: 0.5 mg/dL (ref 0.2–1.2)
Total Protein: 7.2 g/dL (ref 6.0–8.3)

## 2020-01-19 LAB — T4, FREE: Free T4: 0.77 ng/dL (ref 0.60–1.60)

## 2020-01-19 LAB — LIPID PANEL
Cholesterol: 179 mg/dL (ref 0–200)
HDL: 63.5 mg/dL (ref >39.00)
LDL Cholesterol: 96 mg/dL (ref 0–99)
NonHDL: 115.24
Total CHOL/HDL Ratio: 3
Triglycerides: 94 mg/dL (ref 0.0–149.0)
VLDL: 18.8 mg/dL (ref 0.0–40.0)

## 2020-01-19 LAB — TSH: TSH: 6.06 u[IU]/mL — ABNORMAL HIGH (ref 0.35–4.50)

## 2020-01-19 LAB — T3, FREE: T3, Free: 2.8 pg/mL (ref 2.3–4.2)

## 2020-01-19 NOTE — Progress Notes (Signed)
No critical labs need to be addressed urgently. We will discuss labs in detail at upcoming office visit.   

## 2020-01-20 ENCOUNTER — Other Ambulatory Visit: Payer: BC Managed Care – PPO

## 2020-01-24 ENCOUNTER — Ambulatory Visit (INDEPENDENT_AMBULATORY_CARE_PROVIDER_SITE_OTHER): Payer: BC Managed Care – PPO | Admitting: Family Medicine

## 2020-01-24 ENCOUNTER — Other Ambulatory Visit: Payer: Self-pay

## 2020-01-24 ENCOUNTER — Encounter: Payer: Self-pay | Admitting: Family Medicine

## 2020-01-24 VITALS — BP 108/68 | HR 75 | Temp 97.8°F | Ht 63.0 in | Wt 185.0 lb

## 2020-01-24 DIAGNOSIS — I10 Essential (primary) hypertension: Secondary | ICD-10-CM | POA: Diagnosis not present

## 2020-01-24 DIAGNOSIS — E7800 Pure hypercholesterolemia, unspecified: Secondary | ICD-10-CM | POA: Diagnosis not present

## 2020-01-24 DIAGNOSIS — M7062 Trochanteric bursitis, left hip: Secondary | ICD-10-CM

## 2020-01-24 DIAGNOSIS — E039 Hypothyroidism, unspecified: Secondary | ICD-10-CM | POA: Diagnosis not present

## 2020-01-24 DIAGNOSIS — Z0000 Encounter for general adult medical examination without abnormal findings: Secondary | ICD-10-CM | POA: Diagnosis not present

## 2020-01-24 DIAGNOSIS — M1811 Unilateral primary osteoarthritis of first carpometacarpal joint, right hand: Secondary | ICD-10-CM

## 2020-01-24 DIAGNOSIS — M25551 Pain in right hip: Secondary | ICD-10-CM | POA: Insufficient documentation

## 2020-01-24 NOTE — Assessment & Plan Note (Signed)
Treat with short course NSAIDs. Start home PT. Call if not improving for possible steroid injection.

## 2020-01-24 NOTE — Patient Instructions (Addendum)
Plan on colonoscopy after 04/2020 with Dr. Mechele Collin.  Get coronavirus vaccine when available for you.  Work on low impact exercise, weight loss, healthy eating habits.  When setting mammogram call the office to set bone density. Wear brace on right thumb: CMC joint brace.  Start home stretching for left hip bursitis.  Can use ibuprofen 600-800 mg three times daily x 2 weeks for pain and inflammation.  Call if not improving as expected.

## 2020-01-24 NOTE — Assessment & Plan Note (Signed)
LDL at goal < 100 on pravastatin  

## 2020-01-24 NOTE — Assessment & Plan Note (Signed)
Treat with NSADis shortterm, continue glucosamine and wear CMC joint brace prn.

## 2020-01-24 NOTE — Assessment & Plan Note (Signed)
Well controlled. Continue current medication.  

## 2020-01-24 NOTE — Progress Notes (Signed)
Chief Complaint  Patient presents with  . Annual Exam    R wrist pain, L hip pain, dermatologist froze spot on right cheek last week    History of Present Illness: HPI   The patient is here for annual wellness exam and preventative care.     In last 9 months she has been having pain in right base of thumb.  Pain with rasping items.  No redness, no heat.  no known injury or falls.   left hip pain off and on.. more botehrsome after riding in car.   no fall, or injury.  Pain in lateral hip, mild... worse in last several month   Using glucosamine has helped with knee pain.  Elevated Cholesterol:  LDL at goal < 100 on pravastatin Lab Results  Component Value Date   CHOL 179 01/19/2020   HDL 63.50 01/19/2020   LDLCALC 96 01/19/2020   LDLDIRECT 130.9 11/21/2013   TRIG 94.0 01/19/2020   CHOLHDL 3 01/19/2020  Using medications without problems: Muscle aches: none Diet compliance: moderate Exercise: walking and light strengthening. Other complaints:  Hypertension:   At goal on losartan HCTZ BP Readings from Last 3 Encounters:  01/24/20 108/68  06/11/19 131/64  01/20/19 120/80  Using medication without problems or lightheadedness:  none Chest pain with exertion:none Edema:none Short of breath:none Average home BPs: stable Other issues:  Hypothyroid:   Free t3 and free t4 are stable Lab Results  Component Value Date   TSH 6.06 (H) 01/19/2020     This visit occurred during the SARS-CoV-2 public health emergency.  Safety protocols were in place, including screening questions prior to the visit, additional usage of staff PPE, and extensive cleaning of exam room while observing appropriate contact time as indicated for disinfecting solutions.   COVID 19 screen:  No recent travel or known exposure to COVID19 The patient denies respiratory symptoms of COVID 19 at this time. The importance of social distancing was discussed today.     Review of Systems  Constitutional:  Negative for chills and fever.  HENT: Negative for congestion and ear pain.   Eyes: Negative for pain and redness.  Respiratory: Negative for cough and shortness of breath.   Cardiovascular: Negative for chest pain, palpitations and leg swelling.  Gastrointestinal: Negative for abdominal pain, blood in stool, constipation, diarrhea, nausea and vomiting.  Genitourinary: Negative for dysuria.  Musculoskeletal: Negative for falls and myalgias.  Skin: Negative for rash.  Neurological: Negative for dizziness.  Psychiatric/Behavioral: Negative for depression. The patient is not nervous/anxious.       Past Medical History:  Diagnosis Date  . Hyperlipidemia   . Hypertension   . Shortness of breath dyspnea     reports that she has never smoked. She has never used smokeless tobacco. She reports current alcohol use. She reports that she does not use drugs.   Current Outpatient Medications:  .  aspirin (BAYER ASPIRIN EC LOW DOSE) 81 MG EC tablet, , Disp: , Rfl:  .  Azelaic Acid (FINACEA) 15 % cream, Apply 1 application topically 2 (two) times daily. After skin is thoroughly washed and patted dry, gently but thoroughly massage a thin film of azelaic acid cream into the affected area twice daily, in the morning and evening., Disp: , Rfl:  .  Calcium Carbonate-Vitamin D (CALCIUM-VITAMIN D) 600-200 MG-UNIT CAPS, Take 1 capsule by mouth 2 (two) times daily., Disp: , Rfl:  .  diphenhydrAMINE (SOMINEX) 25 MG tablet, Take 25 mg by mouth at bedtime as  needed., Disp: , Rfl:  .  levothyroxine (SYNTHROID) 50 MCG tablet, TAKE 1 TABLET BY MOUTH EVERY DAY, Disp: 90 tablet, Rfl: 3 .  losartan-hydrochlorothiazide (HYZAAR) 50-12.5 MG tablet, TAKE 1 TABLET BY MOUTH EVERY DAY, Disp: 90 tablet, Rfl: 3 .  Misc Natural Products (GLUCOSAMINE CHOND CMP ADVANCED) TABS, , Disp: , Rfl:  .  omeprazole (PRILOSEC) 20 MG capsule, Take 20 mg by mouth every other day. , Disp: , Rfl:  .  pravastatin (PRAVACHOL) 20 MG tablet, TAKE 1  TABLET BY MOUTH EVERY DAY, Disp: 90 tablet, Rfl: 3   Observations/Objective: Blood pressure 108/68, pulse 75, temperature 97.8 F (36.6 C), temperature source Temporal, height 5\' 3"  (1.6 m), weight 185 lb (83.9 kg), SpO2 98 %.  Physical Exam Constitutional:      General: She is not in acute distress.    Appearance: Normal appearance. She is well-developed. She is not ill-appearing or toxic-appearing.  HENT:     Head: Normocephalic.     Right Ear: Hearing, tympanic membrane, ear canal and external ear normal.     Left Ear: Hearing, tympanic membrane, ear canal and external ear normal.     Nose: Nose normal.  Eyes:     General: Lids are normal. Lids are everted, no foreign bodies appreciated.     Conjunctiva/sclera: Conjunctivae normal.     Pupils: Pupils are equal, round, and reactive to light.  Neck:     Thyroid: No thyroid mass or thyromegaly.     Vascular: No carotid bruit.     Trachea: Trachea normal.  Cardiovascular:     Rate and Rhythm: Normal rate and regular rhythm.     Heart sounds: Normal heart sounds, S1 normal and S2 normal. No murmur. No gallop.   Pulmonary:     Effort: Pulmonary effort is normal. No respiratory distress.     Breath sounds: Normal breath sounds. No wheezing, rhonchi or rales.  Abdominal:     General: Bowel sounds are normal. There is no distension or abdominal bruit.     Palpations: Abdomen is soft. There is no fluid wave or mass.     Tenderness: There is no abdominal tenderness. There is no guarding or rebound.     Hernia: No hernia is present.  Musculoskeletal:     Right wrist: Tenderness present. No swelling or snuff box tenderness. Normal range of motion.     Left wrist: Normal.     Cervical back: Normal range of motion and neck supple.     Left hip: No deformity or bony tenderness. Normal range of motion. Normal strength.     Comments: ttp over left lateral hip over trochanteric bursa , no ttp in sciatica notch or in low back.  neg SLR on  left   nml gait  Lymphadenopathy:     Cervical: No cervical adenopathy.  Skin:    General: Skin is warm and dry.     Findings: No rash.  Neurological:     Mental Status: She is alert.     Cranial Nerves: No cranial nerve deficit.     Sensory: No sensory deficit.  Psychiatric:        Mood and Affect: Mood is not anxious or depressed.        Speech: Speech normal.        Behavior: Behavior normal. Behavior is cooperative.        Judgment: Judgment normal.      Assessment and Plan The patient's preventative maintenance and recommended screening tests  for an annual wellness exam were reviewed in full today. Brought up to date unless services declined.  Counselled on the importance of diet, exercise, and its role in overall health and mortality. The patient's FH and SH was reviewed, including their home life, tobacco status, and drug and alcohol status.   Vaccines: Tdap uptodate, Colon: 04/2015: polyp, Dr. Markham Jordan repeat in 5 years father with anal cancer  Mammo: nml 11/2019 PAP/DVE: Nml pap 2018,no HPV, q5year,  No family history of ovarian cancer Nonsmoker. HIV: refused Hep C: neg   ESSENTIAL HYPERTENSION, BENIGN Well controlled. Continue current medication.   HYPERCHOLESTEROLEMIA LDL at goal < 100 on pravastatin  Arthritis of carpometacarpal [facility]) joint of right thumb Treat with NSADis shortterm, continue glucosamine and wear CMC joint brace prn.  Greater trochanteric bursitis of left hip Treat with short course NSAIDs. Start home PT. Call if not improving for possible steroid injection.    Kerby Nora, MD

## 2020-01-26 ENCOUNTER — Other Ambulatory Visit: Payer: Self-pay | Admitting: Family Medicine

## 2020-02-22 ENCOUNTER — Other Ambulatory Visit: Payer: Self-pay | Admitting: Family Medicine

## 2020-03-09 ENCOUNTER — Ambulatory Visit: Payer: BC Managed Care – PPO | Attending: Internal Medicine

## 2020-03-09 DIAGNOSIS — Z23 Encounter for immunization: Secondary | ICD-10-CM

## 2020-03-09 NOTE — Progress Notes (Signed)
   Covid-19 Vaccination Clinic  Name:  Summer Hernandez    MRN: [id number] DOB: 10-24-1961  03/09/2020  Ms. [name] was observed post Covid-19 immunization for 15 minutes without incident. She was provided with Vaccine Information Sheet and instruction to access the V-Safe system.   Ms. [name] was instructed to call 911 with any severe reactions post vaccine: Marland Kitchen Difficulty breathing  . Swelling of face and throat  . A fast heartbeat  . A bad rash all over body  . Dizziness and weakness   Immunizations Administered    Name Date Dose VIS Date Route   Pfizer COVID-19 Vaccine 03/09/2020  8:54 AM 0.3 mL 11/18/2019 Intramuscular   Manufacturer: [employer], [employer]   Lot: [id number]   NDC: [address]-1

## 2020-04-04 ENCOUNTER — Ambulatory Visit: Payer: BC Managed Care – PPO | Attending: Internal Medicine

## 2020-04-04 DIAGNOSIS — Z23 Encounter for immunization: Secondary | ICD-10-CM

## 2020-04-04 NOTE — Progress Notes (Signed)
   Covid-19 Vaccination Clinic  Name:  Summer Hernandez    MRN: [id number] DOB: 1961/05/24  04/04/2020  [name] was observed post Covid-19 immunization for 15 minutes without incident. She was provided with Vaccine Information Sheet and instruction to access the V-Safe system.   [name] was instructed to call 911 with any severe reactions post vaccine: Marland Kitchen Difficulty breathing  . Swelling of face and throat  . A fast heartbeat  . A bad rash all over body  . Dizziness and weakness   Immunizations Administered    Name Date Dose VIS Date Route   Pfizer COVID-19 Vaccine 04/04/2020  8:43 AM 0.3 mL 02/01/2019 Intramuscular   Manufacturer: [employer], [employer]   Lot: [id number]   NDC: [address]-3

## 2020-07-03 ENCOUNTER — Encounter: Payer: Self-pay | Admitting: Family Medicine

## 2020-07-03 ENCOUNTER — Other Ambulatory Visit: Payer: Self-pay

## 2020-07-03 ENCOUNTER — Ambulatory Visit: Payer: BC Managed Care – PPO | Admitting: Family Medicine

## 2020-07-03 VITALS — BP 104/80 | HR 73 | Temp 97.7°F | Ht 63.0 in | Wt 174.8 lb

## 2020-07-03 DIAGNOSIS — T63441A Toxic effect of venom of bees, accidental (unintentional), initial encounter: Secondary | ICD-10-CM

## 2020-07-03 MED ORDER — METHYLPREDNISOLONE ACETATE 40 MG/ML IJ SUSP
40.0000 mg | Freq: Once | INTRAMUSCULAR | Status: AC
  Administered 2020-07-03: 40 mg via INTRAMUSCULAR

## 2020-07-03 NOTE — Patient Instructions (Signed)
Bee, Wasp, or [employer], Adult Bees, wasps, and hornets are part of a family of insects that can sting people. These stings can cause pain and inflammation, but they are usually not serious. However, some people may have an allergic reaction to a sting. This can cause the symptoms to be more severe. What increases the risk? You may be at a greater risk of getting stung if you:  Provoke a stinging insect by swatting or disturbing it.  Wear strong-smelling soaps, deodorants, or body sprays.  Spend time outdoors near gardens with flowers or fruit trees or in clothes that expose skin.  Eat or drink outside. What are the signs or symptoms? Common symptoms of this condition include:  A red lump in the skin that sometimes has a tiny hole in the center. In some cases, a stinger may be in the center of the wound.  Pain and itching at the sting site.  Redness and swelling around the sting site. If you have an allergic reaction (localized allergic reaction), the swelling and redness may spread out from the sting site. In some cases, this reaction can continue to develop over the next 24-48 hours. In rare cases, a person may have a severe allergic reaction (anaphylactic reaction) to a sting. Symptoms of an anaphylactic reaction may include:  Wheezing or difficulty breathing.  Raised, itchy, red patches on the skin (hives).  Nausea or vomiting.  Abdominal cramping.  Diarrhea.  Tightness in the chest or chest pain.  Dizziness or fainting.  Redness of the face (flushing).  Hoarse voice.  Swollen tongue, lips, or face. How is this diagnosed? This condition is usually diagnosed based on your symptoms and medical history as well as a physical exam. You may have an allergy test to determine if you are allergic to the substance that the insect injected during the sting (venom). How is this treated? If you were stung by a bee, the stinger and a small sac of venom may be in the wound. It is  important to remove the stinger as soon as possible. You can do this by brushing across the wound with gauze, a fingernail, or a flat card such as a credit card. Removing the stinger can help reduce the severity of your body's reaction to the sting. Most stings can be treated with:  Icing to reduce swelling in the area.  Medicines (antihistamines) to treat itching or an allergic reaction.  Medicines to help reduce pain. These may be medicines that you take by mouth, or medicated creams or lotions that you apply to your skin. Pay close attention to your symptoms after you have been stung. If possible, have someone stay with you to make sure you do not have an allergic reaction. If you have any signs of an allergic reaction, call your health care provider. If you have ever had a severe allergic reaction, your health care provider may give you an inhaler or injectable medicine (epinephrine auto-injector) to use if necessary. Follow these instructions at home:   Wash the sting site 2-3 times each day with soap and water as told by your health care provider.  Apply or take over-the-counter and prescription medicines only as told by your health care provider.  If directed, apply ice to the sting area. ? Put ice in a plastic bag. ? Place a towel between your skin and the bag. ? Leave the ice on for 20 minutes, 2-3 times a day.  Do not scratch the sting area.  If  you had a severe allergic reaction to a sting, you may need: ? To wear a medical bracelet or necklace that lists the allergy. ? To learn when and how to use an anaphylaxis kit or epinephrine injection. Your family members and coworkers may also need to learn this. ? To carry an anaphylaxis kit or epinephrine injection with you at all times. How is this prevented?  Avoid swatting at stinging insects and disturbing insect nests.  Do not use fragrant soaps or lotions.  Wear shoes, pants, and long sleeves when spending time outdoors,  especially in grassy areas where stinging insects are common.  Keep outdoor areas free from nests or hives.  Keep food and drink containers covered when eating outdoors.  Avoid working or sitting near flowering plants, if possible.  Wear gloves if you are gardening or working outdoors.  If an attack by a stinging insect or a swarm seems likely in the moment, move away from the area or find a barrier between you and the insect(s), such as a door. Contact a health care provider if:  Your symptoms do not get better in 2-3 days.  You have redness, swelling, or pain that spreads beyond the area of the sting.  You have a fever. Get help right away if: You have symptoms of a severe allergic reaction. These include:  Wheezing or difficulty breathing.  Tightness in the chest or chest pain.  Light-headedness or fainting.  Itchy, raised, red patches on the skin.  Nausea or vomiting.  Abdominal cramping.  Diarrhea.  A swollen tongue or lips, or trouble swallowing.  Dizziness or fainting. Summary  Stings from bees, wasps, and hornets can cause pain and inflammation, but they are usually not serious. However, some people may have an allergic reaction to a sting. This can cause the symptoms to be more severe.  Pay close attention to your symptoms after you have been stung. If possible, have someone stay with you to make sure you do not have an allergic reaction.  Call your health care provider if you have any signs of an allergic reaction. This information is not intended to replace advice given to you by your health care provider. Make sure you discuss any questions you have with your health care provider. Document Revised: 11/19/2017 Document Reviewed: 01/29/2017 Elsevier Patient Education  2020 [employer] Inc.  

## 2020-07-03 NOTE — Assessment & Plan Note (Signed)
Treat with SQ DepoMEdrol 40 mg, benadryl at night and clarintin during the day.  No sign of anapylaxsis

## 2020-07-03 NOTE — Progress Notes (Signed)
Chief Complaint  Patient presents with  . Insect Bite    Bee Sting Left Arm    History of Present Illness: HPI  59 year old female presents with new onset bee sting on left arm.  Bee was not seen but was at [employer].  She reports yesterday she had bee sting to left posterior upper arm.  Treated with hydrocortisone, lidocaine and ice.  Now more swollen, warm to touch, knot under skin.   Very itchy, not painful.   No fever. No issues swallowing or breathing. No oral swelling.    This visit occurred during the SARS-CoV-2 public health emergency.  Safety protocols were in place, including screening questions prior to the visit, additional usage of staff PPE, and extensive cleaning of exam room while observing appropriate contact time as indicated for disinfecting solutions.   COVID 19 screen:  No recent travel or known exposure to COVID19 The patient denies respiratory symptoms of COVID 19 at this time. The importance of social distancing was discussed today.     Review of Systems  Constitutional: Negative for chills and fever.  HENT: Negative for congestion and ear pain.   Eyes: Negative for pain and redness.  Respiratory: Negative for cough and shortness of breath.   Cardiovascular: Negative for chest pain, palpitations and leg swelling.  Gastrointestinal: Negative for abdominal pain, blood in stool, constipation, diarrhea, nausea and vomiting.  Genitourinary: Negative for dysuria.  Musculoskeletal: Negative for falls and myalgias.  Skin: Positive for itching and rash.  Neurological: Negative for dizziness.  Psychiatric/Behavioral: Negative for depression. The patient is not nervous/anxious.       Past Medical History:  Diagnosis Date  . Hyperlipidemia   . Hypertension   . Shortness of breath dyspnea     reports that she has never smoked. She has never used smokeless tobacco. She reports current alcohol use. She reports that she does not use drugs.    Current Outpatient Medications:  .  Apoaequorin (PREVAGEN PO), Take 1 tablet by mouth daily., Disp: , Rfl:  .  aspirin (BAYER ASPIRIN EC LOW DOSE) 81 MG EC tablet, , Disp: , Rfl:  .  Azelaic Acid (FINACEA) 15 % cream, Apply 1 application topically 2 (two) times daily. After skin is thoroughly washed and patted dry, gently but thoroughly massage a thin film of azelaic acid cream into the affected area twice daily, in the morning and evening., Disp: , Rfl:  .  Calcium Carbonate-Vitamin D (CALCIUM-VITAMIN D) 600-200 MG-UNIT CAPS, Take 1 capsule by mouth 2 (two) times daily., Disp: , Rfl:  .  diphenhydrAMINE (SOMINEX) 25 MG tablet, Take 25 mg by mouth at bedtime as needed., Disp: , Rfl:  .  levothyroxine (SYNTHROID) 50 MCG tablet, TAKE 1 TABLET BY MOUTH EVERY DAY, Disp: 90 tablet, Rfl: 3 .  losartan-hydrochlorothiazide (HYZAAR) 50-12.5 MG tablet, TAKE 1 TABLET BY MOUTH EVERY DAY, Disp: 90 tablet, Rfl: 3 .  Misc Natural Products (GLUCOSAMINE CHOND CMP ADVANCED) TABS, , Disp: , Rfl:  .  omeprazole (PRILOSEC) 20 MG capsule, Take 20 mg by mouth every other day. , Disp: , Rfl:  .  pravastatin (PRAVACHOL) 20 MG tablet, TAKE 1 TABLET BY MOUTH EVERY DAY, Disp: 90 tablet, Rfl: 3   Observations/Objective: Blood pressure 104/80, pulse 73, temperature 97.7 F (36.5 C), temperature source Temporal, height 5\' 3"  (1.6 m), weight 174 lb 12 oz (79.3 kg), SpO2 97 %.  Physical Exam Constitutional:      General: She is not in acute  distress.    Appearance: Normal appearance. She is well-developed. She is not ill-appearing or toxic-appearing.  HENT:     Head: Normocephalic.     Right Ear: Hearing, tympanic membrane, ear canal and external ear normal. Tympanic membrane is not erythematous, retracted or bulging.     Left Ear: Hearing, tympanic membrane, ear canal and external ear normal. Tympanic membrane is not erythematous, retracted or bulging.     Nose: No mucosal edema or rhinorrhea.     Right Sinus: No  maxillary sinus tenderness or frontal sinus tenderness.     Left Sinus: No maxillary sinus tenderness or frontal sinus tenderness.     Mouth/Throat:     Pharynx: Uvula midline.  Eyes:     General: Lids are normal. Lids are everted, no foreign bodies appreciated.     Conjunctiva/sclera: Conjunctivae normal.     Pupils: Pupils are equal, round, and reactive to light.  Neck:     Thyroid: No thyroid mass or thyromegaly.     Vascular: No carotid bruit.     Trachea: Trachea normal.  Cardiovascular:     Rate and Rhythm: Normal rate and regular rhythm.     Pulses: Normal pulses.     Heart sounds: Normal heart sounds, S1 normal and S2 normal. No murmur heard.  No friction rub. No gallop.   Pulmonary:     Effort: Pulmonary effort is normal. No tachypnea or respiratory distress.     Breath sounds: Normal breath sounds. No decreased breath sounds, wheezing, rhonchi or rales.  Abdominal:     General: Bowel sounds are normal.     Palpations: Abdomen is soft.     Tenderness: There is no abdominal tenderness.  Musculoskeletal:     Cervical back: Normal range of motion and neck supple.  Skin:    General: Skin is warm and dry.     Findings: No rash.     Comments:  See image  Neurological:     Mental Status: She is alert.  Psychiatric:        Mood and Affect: Mood is not anxious or depressed.        Speech: Speech normal.        Behavior: Behavior normal. Behavior is cooperative.        Thought Content: Thought content normal.        Judgment: Judgment normal.      Assessment and Plan   Bee sting reaction Treat with SQ DepoMEdrol 40 mg, benadryl at night and clarintin during the day.  No sign of anapylaxsis     Kerby Nora, MD

## 2020-07-06 ENCOUNTER — Telehealth: Payer: Self-pay

## 2020-07-06 MED ORDER — CEPHALEXIN 500 MG PO CAPS: 500.0000 mg | ORAL_CAPSULE | Freq: Three times a day (TID) | ORAL | 0 refills | Status: DC

## 2020-07-06 MED ORDER — PREDNISONE 20 MG PO TABS: ORAL_TABLET | ORAL | 0 refills | Status: DC

## 2020-07-06 NOTE — Telephone Encounter (Signed)
Pt left v/m that she was seen on 07/03/20 for bee sting on lt upper arm; today still red and itchy and redness has moved down lt arm to 2" below elbow. Area is red and warm to the touch and itchy; no pain. No difficulty breathing and no swelling in lips,mouth, tongue or throat. Pt used some topical cortisone cream on area and helped itching slightly. Is there any thing else needs to be done. [facility] [place] pt request cb.

## 2020-07-06 NOTE — Telephone Encounter (Signed)
Ms. Faulk notified as instructed by telephone.  Patient states understanding.

## 2020-07-06 NOTE — Telephone Encounter (Signed)
I will  Call in a prednisone taper for her to start and I wills tart her on antibiotics just in case there is a super imposed cellulitis.  Please let pt know ASAP.  If redness spreading or fever, SOB, oral swelling she needs to be seen.

## 2020-10-12 ENCOUNTER — Other Ambulatory Visit: Payer: Self-pay | Admitting: Family Medicine

## 2020-10-12 DIAGNOSIS — Z1231 Encounter for screening mammogram for malignant neoplasm of breast: Secondary | ICD-10-CM

## 2020-11-22 ENCOUNTER — Ambulatory Visit (HOSPITAL_COMMUNITY)
Admission: RE | Admit: 2020-11-22 | Discharge: 2020-11-22 | Disposition: A | Discharge status: Home / Self Care | Payer: BC Managed Care – PPO | Source: Ambulatory Visit | Attending: Pulmonary Disease | Admitting: Pulmonary Disease

## 2020-11-22 ENCOUNTER — Other Ambulatory Visit: Payer: Self-pay | Admitting: Family

## 2020-11-22 ENCOUNTER — Telehealth: Payer: Self-pay | Admitting: Family

## 2020-11-22 DIAGNOSIS — U071 COVID-19: Secondary | ICD-10-CM | POA: Insufficient documentation

## 2020-11-22 MED ORDER — EPINEPHRINE 0.3 MG/0.3ML IJ SOAJ: 0.3000 mg | Freq: Once | INTRAMUSCULAR | Status: DC | PRN

## 2020-11-22 MED ORDER — SODIUM CHLORIDE 0.9 % IV SOLN: Freq: Once | INTRAVENOUS | Status: AC

## 2020-11-22 MED ORDER — FAMOTIDINE IN NACL 20-0.9 MG/50ML-% IV SOLN: 20.0000 mg | Freq: Once | INTRAVENOUS | Status: DC | PRN

## 2020-11-22 MED ORDER — DIPHENHYDRAMINE HCL 50 MG/ML IJ SOLN: 50.0000 mg | Freq: Once | INTRAMUSCULAR | Status: DC | PRN

## 2020-11-22 MED ORDER — ALBUTEROL SULFATE HFA 108 (90 BASE) MCG/ACT IN AERS: 2.0000 | INHALATION_SPRAY | Freq: Once | RESPIRATORY_TRACT | Status: DC | PRN

## 2020-11-22 MED ORDER — SODIUM CHLORIDE 0.9 % IV SOLN: INTRAVENOUS | Status: DC | PRN

## 2020-11-22 MED ORDER — METHYLPREDNISOLONE SODIUM SUCC 125 MG IJ SOLR: 125.0000 mg | Freq: Once | INTRAMUSCULAR | Status: DC | PRN

## 2020-11-22 NOTE — Progress Notes (Signed)
Patient reviewed Fact Sheet for Patients, Parents, and Caregivers for Emergency Use Authorization (EUA) of bamlanivimab and etesevimab for the Treatment of Coronavirus. Patient also reviewed and is agreeable to the estimated cost of treatment. Patient is agreeable to proceed.   

## 2020-11-22 NOTE — Discharge Instructions (Signed)
10 Things You Can Do to Manage Your COVID-19 Symptoms at Home If you have possible or confirmed COVID-19: 1. Stay home from work and school. And stay away from other public places. If you must go out, avoid using any kind of public transportation, ridesharing, or taxis. 2. Monitor your symptoms carefully. If your symptoms get worse, call your healthcare provider immediately. 3. Get rest and stay hydrated. 4. If you have a medical appointment, call the healthcare provider ahead of time and tell them that you have or may have COVID-19. 5. For medical emergencies, call 911 and notify the dispatch personnel that you have or may have COVID-19. 6. Cover your cough and sneezes with a tissue or use the inside of your elbow. 7. Wash your hands often with soap and water for at least 20 seconds or clean your hands with an alcohol-based hand sanitizer that contains at least 60% alcohol. 8. As much as possible, stay in a specific room and away from other people in your home. Also, you should use a separate bathroom, if available. If you need to be around other people in or outside of the home, wear a mask. 9. Avoid sharing personal items with other people in your household, like dishes, towels, and bedding. 10. Clean all surfaces that are touched often, like counters, tabletops, and doorknobs. Use household cleaning sprays or wipes according to the label instructions. cdc.gov/coronavirus 06/08/2019 This information is not intended to replace advice given to you by your health care provider. Make sure you discuss any questions you have with your health care provider. Document Revised: 11/10/2019 Document Reviewed: 11/10/2019 Elsevier Patient Education  2020 Elsevier Inc. What types of side effects do monoclonal antibody drugs cause?  Common side effects  In general, the more common side effects caused by monoclonal antibody drugs include: . Allergic reactions, such as hives or itching . Flu-like signs and  symptoms, including chills, fatigue, fever, and muscle aches and pains . Nausea, vomiting . Diarrhea . Skin rashes . Low blood pressure   The CDC is recommending patients who receive monoclonal antibody treatments wait at least 90 days before being vaccinated.  Currently, there are no data on the safety and efficacy of mRNA COVID-19 vaccines in persons who received monoclonal antibodies or convalescent plasma as part of COVID-19 treatment. Based on the estimated half-life of such therapies as well as evidence suggesting that reinfection is uncommon in the 90 days after initial infection, vaccination should be deferred for at least 90 days, as a precautionary measure until additional information becomes available, to avoid interference of the antibody treatment with vaccine-induced immune responses. If you have any questions or concerns after the infusion please call the Advanced Practice Provider on call at [phone]. This number is ONLY intended for your use regarding questions or concerns about the infusion post-treatment side-effects.  Please do not provide this number to others for use. For return to work notes please contact your primary care provider.   If someone you know is interested in receiving treatment please have them call the COVID hotline at [phone].   

## 2020-11-22 NOTE — Telephone Encounter (Signed)
Symptoms 11/18/20 Tested positive 11/19/20 at [facility] (has photo on phone)  I connected by phone with Jess Barters on 11/22/2020 at 3:22 PM to discuss the potential use of a new treatment for mild to moderate COVID-19 viral infection in non-hospitalized patients.  This patient is a 59 y.o. female that meets the FDA criteria for Emergency Use Authorization of COVID monoclonal antibody casirivimab/imdevimab, bamlanivimab/etesevimab, or sotrovimab.  Has a (+) direct SARS-CoV-2 viral test result  Has mild or moderate COVID-19   Is NOT hospitalized due to COVID-19  Is within 10 days of symptom onset  Has at least one of the high risk factor(s) for progression to severe COVID-19 and/or hospitalization as defined in EUA.  Specific high risk criteria : BMI > 25 and Cardiovascular disease or hypertension  I have spoken and communicated the following to the patient or parent/caregiver regarding COVID monoclonal antibody treatment:  1. FDA has authorized the emergency use for the treatment of mild to moderate COVID-19 in adults and pediatric patients with positive results of direct SARS-CoV-2 viral testing who are 33 years of age and older weighing at least 40 kg, and who are at high risk for progressing to severe COVID-19 and/or hospitalization.  2. The significant known and potential risks and benefits of COVID monoclonal antibody, and the extent to which such potential risks and benefits are unknown.  3. Information on available alternative treatments and the risks and benefits of those alternatives, including clinical trials.  4. Patients treated with COVID monoclonal antibody should continue to self-isolate and use infection control measures (e.g., wear mask, isolate, social distance, avoid sharing personal items, clean and disinfect "high touch" surfaces, and frequent handwashing) according to CDC guidelines.   5. The patient or parent/caregiver has the option to accept or refuse  COVID monoclonal antibody treatment.  After reviewing this information with the patient, the patient has agreed to receive one of the available covid 19 monoclonal antibodies and will be provided an appropriate fact sheet prior to infusion. Alver Sorrow, NP 11/22/2020 3:22 PM

## 2020-12-11 IMAGING — MG DIGITAL SCREENING BILAT W/ TOMO W/ CAD
8 series · 8 of 24 positions shown · non-contrast
Comparison: Previous exam(s).

CLINICAL DATA: Screening.

EXAM:
DIGITAL SCREENING BILATERAL MAMMOGRAM WITH TOMO AND CAD

[L CC synth-2D]
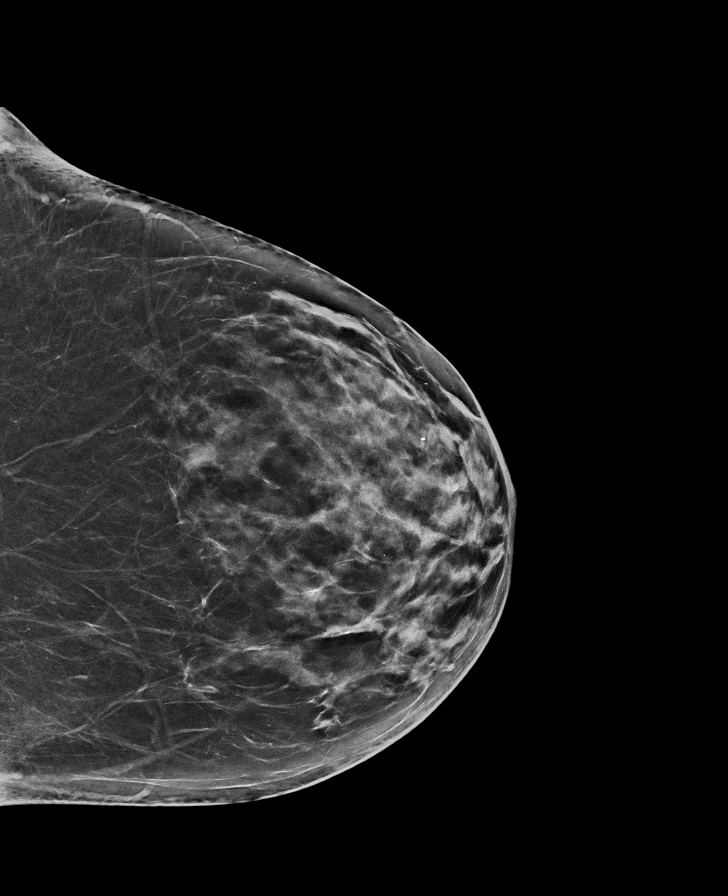

[R CC synth-2D]
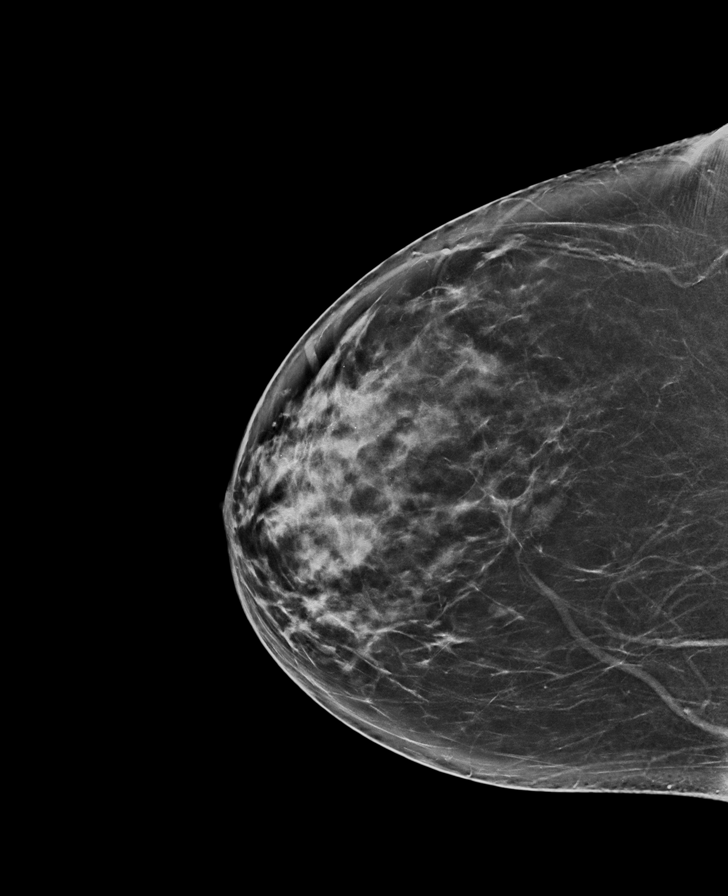

[L MLO synth-2D]
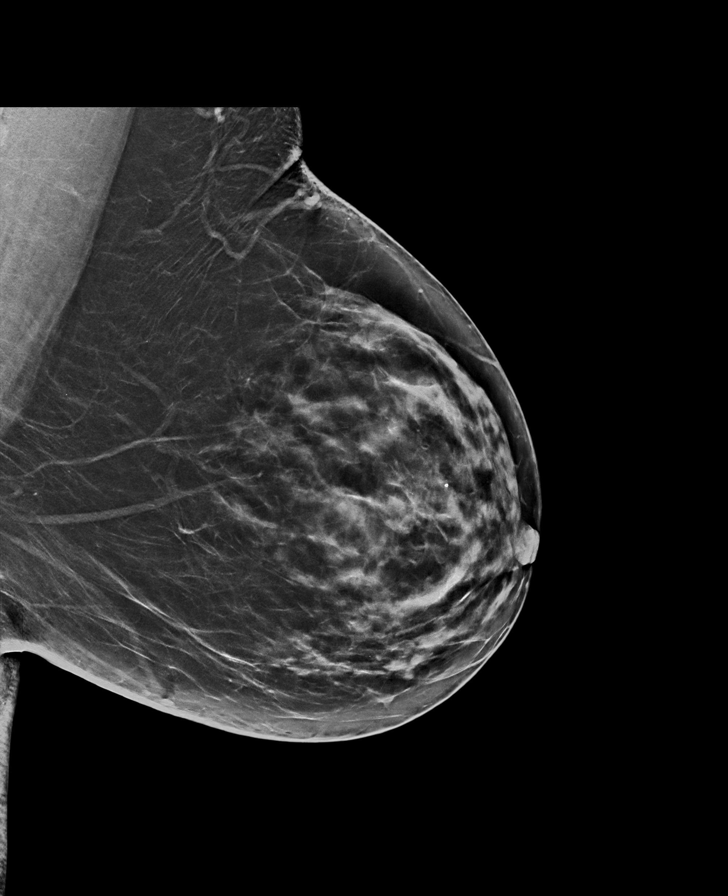

[R MLO synth-2D]
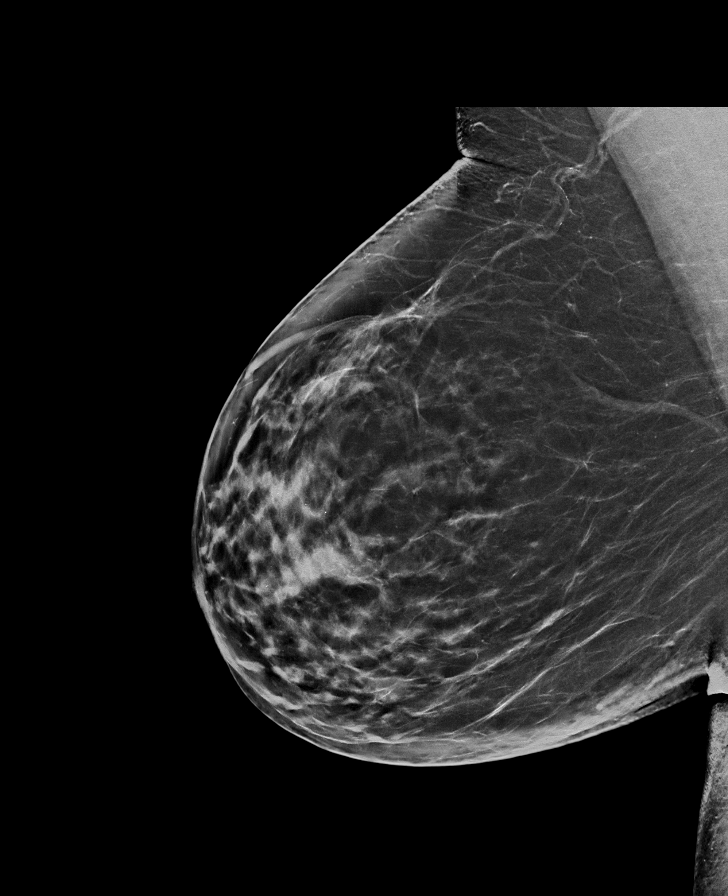

[R CC tomo · tomo slice 39/76.0]
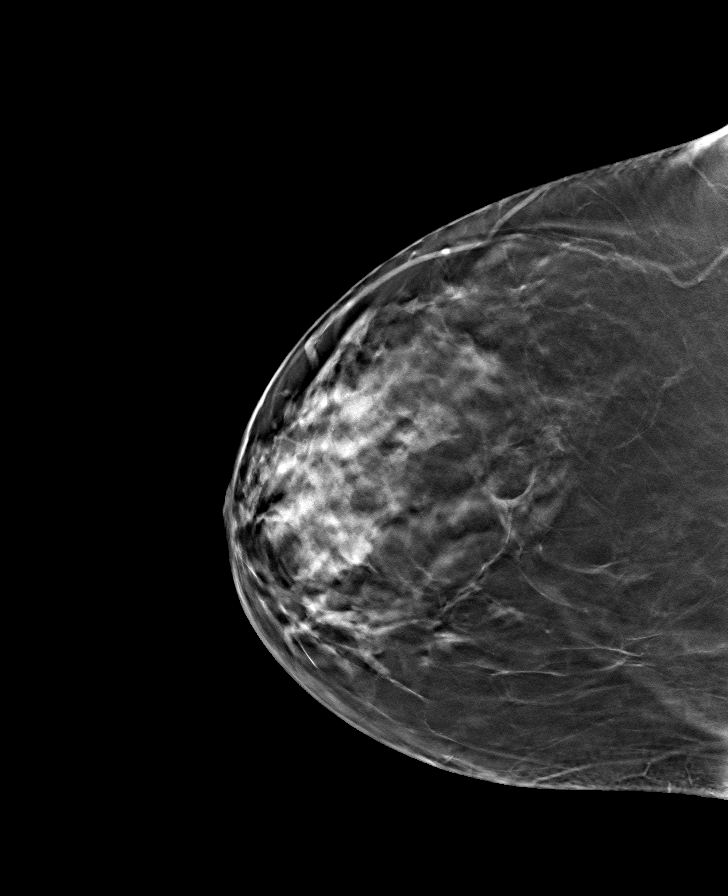

[R MLO tomo · tomo slice 42/83.0]
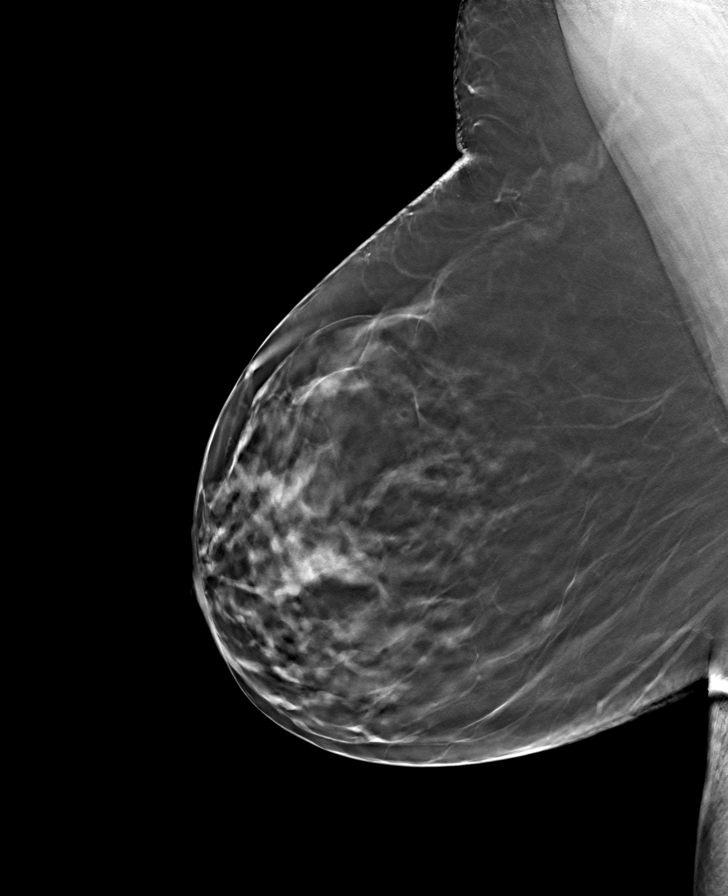

[L CC tomo · tomo slice 39/77.0]
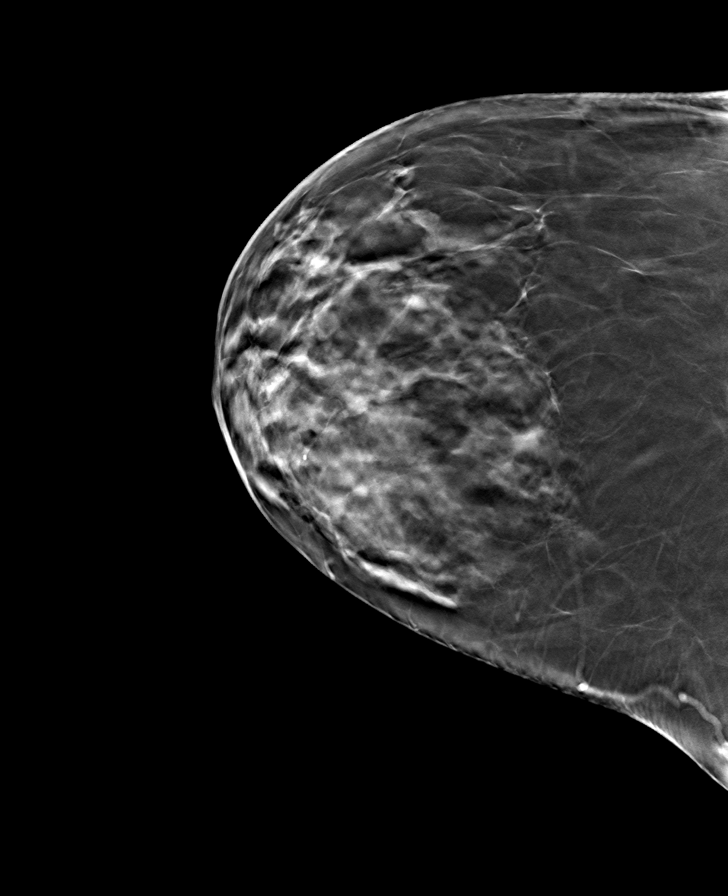

[L MLO tomo · tomo slice 43/85.0]
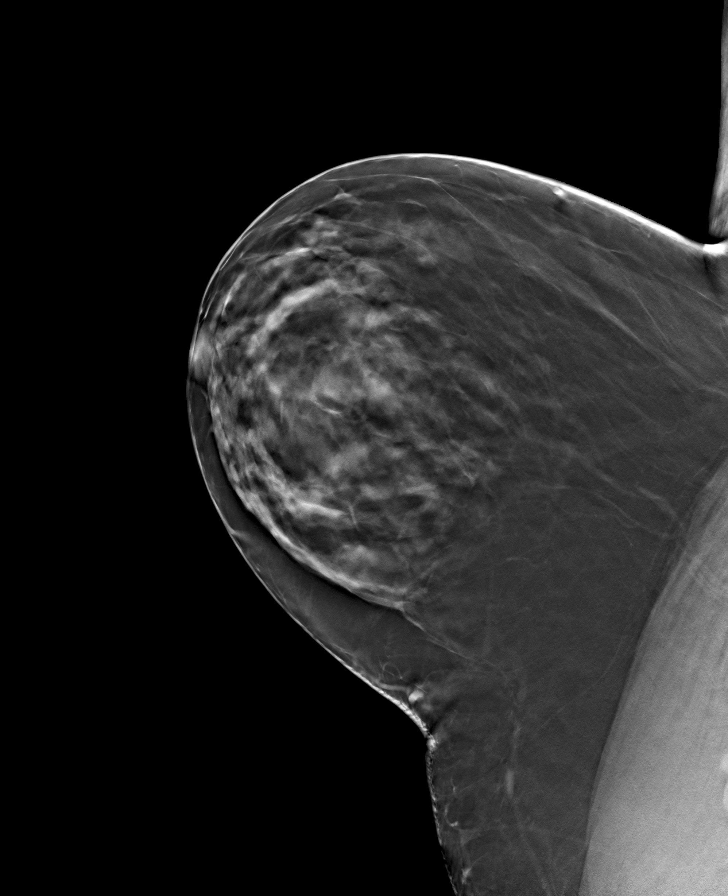

[8 of 24 positions shown; findings below may reference images not displayed]

ACR Breast Density Category c: The breast tissue is heterogeneously
dense, which may obscure small masses.
FINDINGS: There are no findings suspicious for malignancy. Images were
processed with CAD.
IMPRESSION: No mammographic evidence of malignancy. A result letter of this
screening mammogram will be mailed directly to the patient.

RECOMMENDATION:
Screening mammogram in one year. (Code:[id number])

BI-RADS CATEGORY  1: Negative.

## 2020-12-14 LAB — HM COLONOSCOPY

## 2020-12-18 ENCOUNTER — Other Ambulatory Visit: Payer: Self-pay

## 2020-12-18 ENCOUNTER — Ambulatory Visit
Admission: RE | Admit: 2020-12-18 | Discharge: 2020-12-18 | Disposition: A | Discharge status: Home / Self Care | Payer: BC Managed Care – PPO | Source: Ambulatory Visit | Attending: Family Medicine | Admitting: Family Medicine

## 2020-12-18 ENCOUNTER — Other Ambulatory Visit: Payer: Self-pay | Admitting: Family Medicine

## 2020-12-18 DIAGNOSIS — Z1231 Encounter for screening mammogram for malignant neoplasm of breast: Secondary | ICD-10-CM | POA: Insufficient documentation

## 2020-12-18 DIAGNOSIS — R928 Other abnormal and inconclusive findings on diagnostic imaging of breast: Secondary | ICD-10-CM

## 2021-01-03 ENCOUNTER — Ambulatory Visit
Admission: RE | Admit: 2021-01-03 | Discharge: 2021-01-03 | Disposition: A | Discharge status: Home / Self Care | Payer: BC Managed Care – PPO | Source: Ambulatory Visit | Attending: Family Medicine | Admitting: Family Medicine

## 2021-01-03 ENCOUNTER — Other Ambulatory Visit: Payer: Self-pay

## 2021-01-03 DIAGNOSIS — R928 Other abnormal and inconclusive findings on diagnostic imaging of breast: Secondary | ICD-10-CM | POA: Insufficient documentation

## 2021-01-14 ENCOUNTER — Telehealth: Payer: Self-pay | Admitting: Family Medicine

## 2021-01-14 DIAGNOSIS — E7800 Pure hypercholesterolemia, unspecified: Secondary | ICD-10-CM

## 2021-01-14 DIAGNOSIS — E039 Hypothyroidism, unspecified: Secondary | ICD-10-CM

## 2021-01-14 NOTE — Telephone Encounter (Signed)
-----   Message from Alvina Chou sent at 01/08/2021  2:34 PM EST ----- Regarding: Lab orders for Friday, 2.18.22 Patient is scheduled for CPX labs, please order future labs, Thanks , Camelia Eng

## 2021-01-17 ENCOUNTER — Other Ambulatory Visit: Payer: Self-pay | Admitting: Family Medicine

## 2021-01-25 ENCOUNTER — Other Ambulatory Visit (INDEPENDENT_AMBULATORY_CARE_PROVIDER_SITE_OTHER): Payer: BC Managed Care – PPO

## 2021-01-25 ENCOUNTER — Other Ambulatory Visit: Payer: Self-pay

## 2021-01-25 DIAGNOSIS — E039 Hypothyroidism, unspecified: Secondary | ICD-10-CM | POA: Diagnosis not present

## 2021-01-25 DIAGNOSIS — E7800 Pure hypercholesterolemia, unspecified: Secondary | ICD-10-CM | POA: Diagnosis not present

## 2021-01-25 LAB — COMPREHENSIVE METABOLIC PANEL
ALT: 16 U/L (ref 0–35)
AST: 20 U/L (ref 0–37)
Albumin: 4.6 g/dL (ref 3.5–5.2)
Alkaline Phosphatase: 37 U/L — ABNORMAL LOW (ref 39–117)
BUN: 25 mg/dL — ABNORMAL HIGH (ref 6–23)
CO2: 30 mEq/L (ref 19–32)
Calcium: 10 mg/dL (ref 8.4–10.5)
Chloride: 101 mEq/L (ref 96–112)
Creatinine, Ser: 0.89 mg/dL (ref 0.40–1.20)
GFR: 70.07 mL/min (ref >60.00)
Glucose, Bld: 93 mg/dL (ref 70–99)
Potassium: 4.6 mEq/L (ref 3.5–5.1)
Sodium: 140 mEq/L (ref 135–145)
Total Bilirubin: 0.4 mg/dL (ref 0.2–1.2)
Total Protein: 7.1 g/dL (ref 6.0–8.3)

## 2021-01-25 LAB — LIPID PANEL
Cholesterol: 203 mg/dL — ABNORMAL HIGH (ref 0–200)
HDL: 81 mg/dL (ref >39.00)
LDL Cholesterol: 111 mg/dL — ABNORMAL HIGH (ref 0–99)
NonHDL: 121.66
Total CHOL/HDL Ratio: 3
Triglycerides: 53 mg/dL (ref 0.0–149.0)
VLDL: 10.6 mg/dL (ref 0.0–40.0)

## 2021-01-25 LAB — TSH: TSH: 4.64 u[IU]/mL — ABNORMAL HIGH (ref 0.35–4.50)

## 2021-01-25 LAB — T3, FREE: T3, Free: 2.9 pg/mL (ref 2.3–4.2)

## 2021-01-25 LAB — T4, FREE: Free T4: 0.93 ng/dL (ref 0.60–1.60)

## 2021-01-25 NOTE — Progress Notes (Signed)
No critical labs need to be addressed urgently. We will discuss labs in detail at upcoming office visit.   

## 2021-02-01 ENCOUNTER — Other Ambulatory Visit: Payer: Self-pay

## 2021-02-01 ENCOUNTER — Encounter: Payer: Self-pay | Admitting: Family Medicine

## 2021-02-01 ENCOUNTER — Ambulatory Visit (INDEPENDENT_AMBULATORY_CARE_PROVIDER_SITE_OTHER): Payer: BC Managed Care – PPO | Admitting: Family Medicine

## 2021-02-01 VITALS — BP 102/70 | HR 69 | Temp 97.5°F | Ht 62.75 in | Wt 163.5 lb

## 2021-02-01 DIAGNOSIS — E7800 Pure hypercholesterolemia, unspecified: Secondary | ICD-10-CM | POA: Diagnosis not present

## 2021-02-01 DIAGNOSIS — M25512 Pain in left shoulder: Secondary | ICD-10-CM

## 2021-02-01 DIAGNOSIS — Z0000 Encounter for general adult medical examination without abnormal findings: Secondary | ICD-10-CM

## 2021-02-01 DIAGNOSIS — I10 Essential (primary) hypertension: Secondary | ICD-10-CM | POA: Diagnosis not present

## 2021-02-01 DIAGNOSIS — E039 Hypothyroidism, unspecified: Secondary | ICD-10-CM | POA: Diagnosis not present

## 2021-02-01 NOTE — Assessment & Plan Note (Signed)
Likely  Rotator cuff tendonitis. Start with topical voltaren gel and start home exercises.. info given.

## 2021-02-01 NOTE — Progress Notes (Signed)
Patient ID: Summer Hernandez, female    DOB: 1961-06-08, 60 y.o.   MRN: [id number]  This visit was conducted in person.  BP 102/70   Pulse 69   Temp (!) 97.5 F (36.4 C) (Temporal)   Ht 5' 2.75" (1.594 m)   Wt 163 lb 8 oz (74.2 kg)   SpO2 95%   BMI 29.19 kg/m    CC:  Chief Complaint  Patient presents with  . Annual Exam    Subjective:   HPI: Summer Hernandez is a 60 y.o. female presenting on 02/01/2021 for Annual Exam   In last 6-9 months she has noted pain with reaching back in posterior left shoulder... ext rotation.  no fall, no known injury.  Not bad enough to use medication to treat.   Hypertension:   At goal on losartan HCTZ 50/12.5 mg daily BP Readings from Last 3 Encounters:  02/01/21 102/70  11/22/20 120/81  07/03/20 104/80  Using medication without problems or lightheadedness: occ Chest pain with exertion: none Edema: none Short of breath: none Average home BPs: Other issues:  20 lbs since last year Wt Readings from Last 3 Encounters:  02/01/21 163 lb 8 oz (74.2 kg)  07/03/20 174 lb 12 oz (79.3 kg)  01/24/20 185 lb (83.9 kg)  Body mass index is 29.19 kg/m.   Elevated Cholesterol:  Good control on pravastatin 20 mg daily Lab Results  Component Value Date   CHOL 203 (H) 01/25/2021   HDL 81.00 01/25/2021   LDLCALC 111 (H) 01/25/2021   LDLDIRECT 130.9 11/21/2013   TRIG 53.0 01/25/2021   CHOLHDL 3 01/25/2021  Using medications without problems: Muscle aches:  Diet compliance: keto  Exercise: Going to gym 2-3 times a week, walking some. The 10-year ASCVD risk score Denman George DC Montez Hageman., et al., 2013) is: 2.5%   Values used to calculate the score:     Age: 67 years     Sex: Female     Is Non-Hispanic African American: No     Diabetic: No     Tobacco smoker: No     Systolic Blood Pressure: 102 mmHg     Is BP treated: Yes     HDL Cholesterol: 81 mg/dL     Total Cholesterol: 203 mg/dL Other complaints:  Hypothyroid  Lab Results  Component Value  Date   TSH 4.64 (H) 01/25/2021       Relevant past medical, surgical, family and social history reviewed and updated as indicated. Interim medical history since our last visit reviewed. Allergies and medications reviewed and updated. Outpatient Medications Prior to Visit  Medication Sig Dispense Refill  . Apoaequorin (PREVAGEN PO) Take 1 tablet by mouth daily.    Marland Kitchen aspirin 81 MG EC tablet     . Calcium Carbonate-Vitamin D (CALCIUM-VITAMIN D) 600-200 MG-UNIT CAPS Take 1 capsule by mouth 2 (two) times daily.    . diphenhydrAMINE (SOMINEX) 25 MG tablet Take 25 mg by mouth at bedtime as needed.    Marland Kitchen levothyroxine (SYNTHROID) 50 MCG tablet TAKE 1 TABLET BY MOUTH EVERY DAY 90 tablet 3  . losartan-hydrochlorothiazide (HYZAAR) 50-12.5 MG tablet TAKE 1 TABLET BY MOUTH EVERY DAY 90 tablet 0  . Misc Natural Products (GLUCOSAMINE CHOND CMP ADVANCED) TABS     . omeprazole (PRILOSEC) 20 MG capsule Take 20 mg by mouth every other day.     . pravastatin (PRAVACHOL) 20 MG tablet TAKE 1 TABLET BY MOUTH EVERY DAY 90 tablet 0  . SOOLANTRA 1 %  CREA Apply topically.    . Azelaic Acid (FINACEA) 15 % cream Apply 1 application topically 2 (two) times daily. After skin is thoroughly washed and patted dry, gently but thoroughly massage a thin film of azelaic acid cream into the affected area twice daily, in the morning and evening.    . cephALEXin (KEFLEX) 500 MG capsule Take 1 capsule (500 mg total) by mouth 3 (three) times daily. 21 capsule 0  . predniSONE (DELTASONE) 20 MG tablet 3 tabs by mouth daily x 3 days, then 2 tabs by mouth daily x 2 days then 1 tab by mouth daily x 2 days 15 tablet 0   No facility-administered medications prior to visit.     Per HPI unless specifically indicated in ROS section below Review of Systems  Constitutional: Negative for fatigue and fever.  HENT: Negative for congestion.   Eyes: Negative for pain.  Respiratory: Negative for cough and shortness of breath.   Cardiovascular:  Negative for chest pain, palpitations and leg swelling.  Gastrointestinal: Negative for abdominal pain.  Genitourinary: Negative for dysuria and vaginal bleeding.  Musculoskeletal: Negative for back pain.  Neurological: Negative for syncope, light-headedness and headaches.  Psychiatric/Behavioral: Negative for dysphoric mood.   Objective:  BP 102/70   Pulse 69   Temp (!) 97.5 F (36.4 C) (Temporal)   Ht 5' 2.75" (1.594 m)   Wt 163 lb 8 oz (74.2 kg)   SpO2 95%   BMI 29.19 kg/m   Wt Readings from Last 3 Encounters:  02/01/21 163 lb 8 oz (74.2 kg)  07/03/20 174 lb 12 oz (79.3 kg)  01/24/20 185 lb (83.9 kg)      Physical Exam Constitutional:      General: She is not in acute distress.Vital signs are normal.     Appearance: Normal appearance. She is well-developed and well-nourished. She is not ill-appearing or toxic-appearing.  HENT:     Head: Normocephalic.     Right Ear: Hearing, tympanic membrane, ear canal and external ear normal. Tympanic membrane is not erythematous, retracted or bulging.     Left Ear: Hearing, tympanic membrane, ear canal and external ear normal. Tympanic membrane is not erythematous, retracted or bulging.     Nose: No mucosal edema or rhinorrhea.     Right Sinus: No maxillary sinus tenderness or frontal sinus tenderness.     Left Sinus: No maxillary sinus tenderness or frontal sinus tenderness.     Mouth/Throat:     Mouth: Oropharynx is clear and moist and mucous membranes are normal.     Pharynx: Uvula midline.  Eyes:     General: Lids are normal. Lids are everted, no foreign bodies appreciated.     Extraocular Movements: EOM normal.     Conjunctiva/sclera: Conjunctivae normal.     Pupils: Pupils are equal, round, and reactive to light.  Neck:     Thyroid: No thyroid mass or thyromegaly.     Vascular: No carotid bruit.     Trachea: Trachea normal.  Cardiovascular:     Rate and Rhythm: Normal rate and regular rhythm.     Pulses: Normal pulses and  intact distal pulses.     Heart sounds: Normal heart sounds, S1 normal and S2 normal. No murmur heard. No friction rub. No gallop.   Pulmonary:     Effort: Pulmonary effort is normal. No tachypnea or respiratory distress.     Breath sounds: Normal breath sounds. No decreased breath sounds, wheezing, rhonchi or rales.  Abdominal:  General: Bowel sounds are normal.     Palpations: Abdomen is soft.     Tenderness: There is no abdominal tenderness.  Musculoskeletal:     Right shoulder: Normal.     Left shoulder: Tenderness present. No bony tenderness or crepitus. Normal strength. Normal pulse.     Cervical back: Normal range of motion and neck supple.     Comments: Pain with int and et rotation, neg Neers, neg drop arm.  Skin:    General: Skin is warm, dry and intact.     Findings: No rash.  Neurological:     Mental Status: She is alert.  Psychiatric:        Mood and Affect: Mood is not anxious or depressed.        Speech: Speech normal.        Behavior: Behavior normal. Behavior is cooperative.        Thought Content: Thought content normal.        Cognition and Memory: Cognition and memory normal.        Judgment: Judgment normal.       Results for orders placed or performed in visit on 01/25/21  T3, free  Result Value Ref Range   T3, Free 2.9 2.3 - 4.2 pg/mL  T4, free  Result Value Ref Range   Free T4 0.93 0.60 - 1.60 ng/dL  TSH  Result Value Ref Range   TSH 4.64 (H) 0.35 - 4.50 uIU/mL  Comprehensive metabolic panel  Result Value Ref Range   Sodium 140 135 - 145 mEq/L   Potassium 4.6 3.5 - 5.1 mEq/L   Chloride 101 96 - 112 mEq/L   CO2 30 19 - 32 mEq/L   Glucose, Bld 93 70 - 99 mg/dL   BUN 25 (H) 6 - 23 mg/dL   Creatinine, Ser 1.610.89 0.40 - 1.20 mg/dL   Total Bilirubin 0.4 0.2 - 1.2 mg/dL   Alkaline Phosphatase 37 (L) 39 - 117 U/L   AST 20 0 - 37 U/L   ALT 16 0 - 35 U/L   Total Protein 7.1 6.0 - 8.3 g/dL   Albumin 4.6 3.5 - 5.2 g/dL   GFR 09.6070.07 >45.40>60.00 mL/min    Calcium 10.0 8.4 - 10.5 mg/dL  Lipid panel  Result Value Ref Range   Cholesterol 203 (H) 0 - 200 mg/dL   Triglycerides 98.153.0 0.0 - 149.0 mg/dL   HDL 19.1481.00 >78.29>39.00 mg/dL   VLDL 56.210.6 0.0 - 13.040.0 mg/dL   LDL Cholesterol 865111 (H) 0 - 99 mg/dL   Total CHOL/HDL Ratio 3    NonHDL 121.66     This visit occurred during the SARS-CoV-2 public health emergency.  Safety protocols were in place, including screening questions prior to the visit, additional usage of staff PPE, and extensive cleaning of exam room while observing appropriate contact time as indicated for disinfecting solutions.   COVID 19 screen:  No recent travel or known exposure to COVID19 The patient denies respiratory symptoms of COVID 19 at this time. The importance of social distancing was discussed today.   Assessment and Plan   The patient's preventative maintenance and recommended screening tests for an annual wellness exam were reviewed in full today. Brought up to date unless services declined.  Counselled on the importance of diet, exercise, and its role in overall health and mortality. The patient's FH and SH was reviewed, including their home life, tobacco status, and drug and alcohol status.   Vaccines: Tdap,  COVID  Booster  planned 90 days after MAB infusion, refused flu, Consider Shingrix. Colon: 12/2020 per pt  [facility], repeat in 5 years. Mammo: nml 12/2020 PAP/DVE: Nml pap 2018,no HPV, q5year,No family history of ovarian cancer Nonsmoker. HIV: refused Hep C: neg  Problem List Items Addressed This Visit    Acute pain of left shoulder    Likely  Rotator cuff tendonitis. Start with topical voltaren gel and start home exercises.. info given.      Essential hypertension, benign (Chronic)    Stable, chronic.  Continue current medication.   on losartan HCTZ 50/12.5 mg daily      HYPERCHOLESTEROLEMIA (Chronic)    Stable, chronic.  Continue current medication.   On pravastatin 20 mg daily       Hypothyroidism (Chronic)    Stable, chronic.  Continue current medication.   Levothyroxine 50 mcg daily       Other Visit Diagnoses    Routine general medical examination at a health care facility    -  Primary      Kerby Nora, MD

## 2021-02-01 NOTE — Assessment & Plan Note (Signed)
Stable, chronic.  Continue current medication.   on losartan HCTZ 50/12.5 mg daily

## 2021-02-01 NOTE — Assessment & Plan Note (Signed)
Stable, chronic.  Continue current medication.   Levothyroxine 50 mcg daily 

## 2021-02-01 NOTE — Assessment & Plan Note (Signed)
Stable, chronic.  Continue current medication.   On pravastatin 20 mg daily

## 2021-02-01 NOTE — Patient Instructions (Addendum)
Follow BP at home.. call if running < 100/70 consistently or if consistently lightheaded.. we will likely be able to decrease the medication.  Consider Shingrix vaccine.  Start home stretching exercises and can use voltaren gel/cream on thumbs and left shoulder.

## 2021-02-13 ENCOUNTER — Encounter: Payer: Self-pay | Admitting: Family Medicine

## 2021-02-19 ENCOUNTER — Other Ambulatory Visit: Payer: Self-pay | Admitting: Family Medicine

## 2021-04-16 ENCOUNTER — Other Ambulatory Visit: Payer: Self-pay | Admitting: Family Medicine

## 2021-09-16 ENCOUNTER — Other Ambulatory Visit: Payer: Self-pay

## 2021-11-25 ENCOUNTER — Encounter: Payer: Self-pay | Admitting: Family Medicine

## 2022-01-11 ENCOUNTER — Other Ambulatory Visit: Payer: Self-pay | Admitting: Family Medicine

## 2022-01-13 ENCOUNTER — Other Ambulatory Visit: Payer: Self-pay | Admitting: Family Medicine

## 2022-01-13 DIAGNOSIS — Z1231 Encounter for screening mammogram for malignant neoplasm of breast: Secondary | ICD-10-CM

## 2022-01-14 ENCOUNTER — Telehealth: Payer: Self-pay | Admitting: Family Medicine

## 2022-01-14 DIAGNOSIS — E7800 Pure hypercholesterolemia, unspecified: Secondary | ICD-10-CM

## 2022-01-14 DIAGNOSIS — E039 Hypothyroidism, unspecified: Secondary | ICD-10-CM

## 2022-01-14 NOTE — Telephone Encounter (Signed)
-----   Message from Alvina Chou sent at 01/14/2022  7:30 AM EST ----- Regarding: Lab orders for Wednesday, 2.22.23 Patient is scheduled for CPX labs, please order future labs, Thanks , Camelia Eng

## 2022-01-20 ENCOUNTER — Other Ambulatory Visit: Payer: Self-pay

## 2022-01-20 ENCOUNTER — Ambulatory Visit
Admission: RE | Admit: 2022-01-20 | Discharge: 2022-01-20 | Disposition: A | Discharge status: Home / Self Care | Payer: BC Managed Care – PPO | Source: Ambulatory Visit | Attending: Family Medicine | Admitting: Family Medicine

## 2022-01-20 DIAGNOSIS — Z1231 Encounter for screening mammogram for malignant neoplasm of breast: Secondary | ICD-10-CM | POA: Insufficient documentation

## 2022-01-29 ENCOUNTER — Other Ambulatory Visit: Payer: Self-pay

## 2022-01-29 ENCOUNTER — Other Ambulatory Visit (INDEPENDENT_AMBULATORY_CARE_PROVIDER_SITE_OTHER): Payer: BC Managed Care – PPO

## 2022-01-29 DIAGNOSIS — E7800 Pure hypercholesterolemia, unspecified: Secondary | ICD-10-CM | POA: Diagnosis not present

## 2022-01-29 DIAGNOSIS — E039 Hypothyroidism, unspecified: Secondary | ICD-10-CM

## 2022-01-29 LAB — COMPREHENSIVE METABOLIC PANEL
ALT: 24 U/L (ref 0–35)
AST: 24 U/L (ref 0–37)
Albumin: 4.8 g/dL (ref 3.5–5.2)
Alkaline Phosphatase: 42 U/L (ref 39–117)
BUN: 20 mg/dL (ref 6–23)
CO2: 32 mEq/L (ref 19–32)
Calcium: 9.8 mg/dL (ref 8.4–10.5)
Chloride: 103 mEq/L (ref 96–112)
Creatinine, Ser: 0.88 mg/dL (ref 0.40–1.20)
GFR: 70.52 mL/min (ref >60.00)
Glucose, Bld: 92 mg/dL (ref 70–99)
Potassium: 4.6 mEq/L (ref 3.5–5.1)
Sodium: 140 mEq/L (ref 135–145)
Total Bilirubin: 0.6 mg/dL (ref 0.2–1.2)
Total Protein: 7.1 g/dL (ref 6.0–8.3)

## 2022-01-29 LAB — TSH: TSH: 3.63 u[IU]/mL (ref 0.35–5.50)

## 2022-01-29 LAB — T3, FREE: T3, Free: 2.8 pg/mL (ref 2.3–4.2)

## 2022-01-29 LAB — T4, FREE: Free T4: 0.92 ng/dL (ref 0.60–1.60)

## 2022-01-29 LAB — LIPID PANEL
Cholesterol: 203 mg/dL — ABNORMAL HIGH (ref 0–200)
HDL: 70.7 mg/dL (ref >39.00)
LDL Cholesterol: 121 mg/dL — ABNORMAL HIGH (ref 0–99)
NonHDL: 132.78
Total CHOL/HDL Ratio: 3
Triglycerides: 59 mg/dL (ref 0.0–149.0)
VLDL: 11.8 mg/dL (ref 0.0–40.0)

## 2022-01-29 NOTE — Progress Notes (Signed)
No critical labs need to be addressed urgently. We will discuss labs in detail at upcoming office visit.   

## 2022-01-30 ENCOUNTER — Other Ambulatory Visit: Payer: BC Managed Care – PPO

## 2022-02-06 ENCOUNTER — Other Ambulatory Visit: Payer: Self-pay

## 2022-02-06 ENCOUNTER — Ambulatory Visit (INDEPENDENT_AMBULATORY_CARE_PROVIDER_SITE_OTHER): Payer: BC Managed Care – PPO | Admitting: Family Medicine

## 2022-02-06 ENCOUNTER — Other Ambulatory Visit (HOSPITAL_COMMUNITY)
Admission: RE | Admit: 2022-02-06 | Discharge: 2022-02-06 | Disposition: A | Discharge status: Home / Self Care | Payer: BC Managed Care – PPO | Source: Ambulatory Visit | Attending: Family Medicine | Admitting: Family Medicine

## 2022-02-06 VITALS — BP 128/70 | HR 77 | Temp 98.7°F | Resp 16 | Ht 63.0 in | Wt 179.6 lb

## 2022-02-06 DIAGNOSIS — Z0000 Encounter for general adult medical examination without abnormal findings: Secondary | ICD-10-CM | POA: Diagnosis not present

## 2022-02-06 DIAGNOSIS — E039 Hypothyroidism, unspecified: Secondary | ICD-10-CM | POA: Diagnosis not present

## 2022-02-06 DIAGNOSIS — Z124 Encounter for screening for malignant neoplasm of cervix: Secondary | ICD-10-CM | POA: Diagnosis not present

## 2022-02-06 DIAGNOSIS — I10 Essential (primary) hypertension: Secondary | ICD-10-CM

## 2022-02-06 DIAGNOSIS — E7800 Pure hypercholesterolemia, unspecified: Secondary | ICD-10-CM

## 2022-02-06 NOTE — Progress Notes (Signed)
Patient ID: PRAKRITI CARIGNAN, female    DOB: 05/16/1961, 61 y.o.   MRN: [id number]  This visit was conducted in person.  BP 128/70    Pulse 77    Temp 98.7 F (37.1 C)    Resp 16    Ht 5\' 3"  (1.6 m)    Wt 179 lb 9.6 oz (81.5 kg)    SpO2 97%    BMI 31.81 kg/m    CC:  Chief Complaint  Patient presents with   Annual Exam    Subjective:   HPI: Louise Rawson Bostwick is a 61 y.o. female presenting on 02/06/2022 for Annual Exam  She is feeling well overall.  Hypertension:     At goal on losartan HCTZ 50/12.5 mg daily BP Readings from Last 3 Encounters:  02/06/22 128/70  02/01/21 102/70  11/22/20 120/81  Using medication without problems or lightheadedness:  none Chest pain with exertion: none Edema:none Short of breath:none Average home BPs: Other issues:  Elevated Cholesterol:  Good control on pravastatin 20 mg daily Lab Results  Component Value Date   CHOL 203 (H) 01/29/2022   HDL 70.70 01/29/2022   LDLCALC 121 (H) 01/29/2022   LDLDIRECT 130.9 11/21/2013   TRIG 59.0 01/29/2022   CHOLHDL 3 01/29/2022  Using medications without problems: Muscle aches:  Diet compliance: moderate.. she his now back on  track Exercise:walking daily Other complaints: Wt Readings from Last 3 Encounters:  02/06/22 179 lb 9.6 oz (81.5 kg)  02/01/21 163 lb 8 oz (74.2 kg)  07/03/20 174 lb 12 oz (79.3 kg)   The 10-year ASCVD risk score (Arnett DK, et al., 2019) is: 4.8%   Values used to calculate the score:     Age: 13 years     Sex: Female     Is Non-Hispanic African American: No     Diabetic: No     Tobacco smoker: No     Systolic Blood Pressure: 128 mmHg     Is BP treated: Yes     HDL Cholesterol: 70.7 mg/dL     Total Cholesterol: 203 mg/dL   Hypothyroid  good energy overall. Levo 50 mcg daily Lab Results  Component Value Date   TSH 3.63 01/29/2022        Relevant past medical, surgical, family and social history reviewed and updated as indicated. Interim medical history since our  last visit reviewed. Allergies and medications reviewed and updated. Outpatient Medications Prior to Visit  Medication Sig Dispense Refill   Apoaequorin (PREVAGEN PO) Take 1 tablet by mouth daily.     aspirin 81 MG EC tablet      Calcium Carbonate-Vitamin D (CALCIUM-VITAMIN D) 600-200 MG-UNIT CAPS Take 1 capsule by mouth 2 (two) times daily.     diphenhydrAMINE (SOMINEX) 25 MG tablet Take 25 mg by mouth at bedtime as needed.     levothyroxine (SYNTHROID) 50 MCG tablet TAKE 1 TABLET BY MOUTH EVERY DAY 90 tablet 0   losartan-hydrochlorothiazide (HYZAAR) 50-12.5 MG tablet TAKE 1 TABLET BY MOUTH EVERY DAY 90 tablet 3   Misc Natural Products (GLUCOSAMINE CHOND CMP ADVANCED) TABS      omeprazole (PRILOSEC) 20 MG capsule Take 20 mg by mouth every other day.      pravastatin (PRAVACHOL) 20 MG tablet TAKE 1 TABLET BY MOUTH EVERY DAY 90 tablet 3   SOOLANTRA 1 % CREA Apply topically.     No facility-administered medications prior to visit.     Per HPI unless specifically indicated in  ROS section below Review of Systems  Constitutional:  Negative for fatigue and fever.  HENT:  Negative for congestion.   Eyes:  Negative for pain.  Respiratory:  Negative for cough and shortness of breath.   Cardiovascular:  Negative for chest pain, palpitations and leg swelling.  Gastrointestinal:  Negative for abdominal pain.  Genitourinary:  Negative for dysuria and vaginal bleeding.  Musculoskeletal:  Negative for back pain.  Neurological:  Negative for syncope, light-headedness and headaches.  Psychiatric/Behavioral:  Negative for dysphoric mood.   Objective:  BP 128/70    Pulse 77    Temp 98.7 F (37.1 C)    Resp 16    Ht 5\' 3"  (1.6 m)    Wt 179 lb 9.6 oz (81.5 kg)    SpO2 97%    BMI 31.81 kg/m   Wt Readings from Last 3 Encounters:  02/06/22 179 lb 9.6 oz (81.5 kg)  02/01/21 163 lb 8 oz (74.2 kg)  07/03/20 174 lb 12 oz (79.3 kg)      Physical Exam Vitals and nursing note reviewed.  Constitutional:       General: She is not in acute distress.    Appearance: Normal appearance. She is well-developed. She is not ill-appearing or toxic-appearing.  HENT:     Head: Normocephalic.     Right Ear: Hearing, tympanic membrane, ear canal and external ear normal.     Left Ear: Hearing, tympanic membrane, ear canal and external ear normal.     Nose: Nose normal.  Eyes:     General: Lids are normal. Lids are everted, no foreign bodies appreciated.     Conjunctiva/sclera: Conjunctivae normal.     Pupils: Pupils are equal, round, and reactive to light.  Neck:     Thyroid: No thyroid mass or thyromegaly.     Vascular: No carotid bruit.     Trachea: Trachea normal.  Cardiovascular:     Rate and Rhythm: Normal rate and regular rhythm.     Heart sounds: Normal heart sounds, S1 normal and S2 normal. No murmur heard.   No gallop.  Pulmonary:     Effort: Pulmonary effort is normal. No respiratory distress.     Breath sounds: Normal breath sounds. No wheezing, rhonchi or rales.  Abdominal:     General: Bowel sounds are normal. There is no distension or abdominal bruit.     Palpations: Abdomen is soft. There is no fluid wave or mass.     Tenderness: There is no abdominal tenderness. There is no guarding or rebound.     Hernia: No hernia is present.  Genitourinary:    Exam position: Supine.     Labia:        Right: No rash, tenderness or lesion.        Left: No rash, tenderness or lesion.      Vagina: Normal.     Cervix: No cervical motion tenderness, discharge or friability.     Uterus: Not enlarged and not tender.      Adnexa:        Right: No mass, tenderness or fullness.         Left: No mass, tenderness or fullness.    Musculoskeletal:     Cervical back: Normal range of motion and neck supple.  Lymphadenopathy:     Cervical: No cervical adenopathy.  Skin:    General: Skin is warm and dry.     Findings: No rash.  Neurological:     Mental Status: She  is alert.     Cranial Nerves: No  cranial nerve deficit.     Sensory: No sensory deficit.  Psychiatric:        Mood and Affect: Mood is not anxious or depressed.        Speech: Speech normal.        Behavior: Behavior normal. Behavior is cooperative.        Judgment: Judgment normal.      Results for orders placed or performed in visit on 01/29/22  T3, free  Result Value Ref Range   T3, Free 2.8 2.3 - 4.2 pg/mL  T4, free  Result Value Ref Range   Free T4 0.92 0.60 - 1.60 ng/dL  TSH  Result Value Ref Range   TSH 3.63 0.35 - 5.50 uIU/mL  Comprehensive metabolic panel  Result Value Ref Range   Sodium 140 135 - 145 mEq/L   Potassium 4.6 3.5 - 5.1 mEq/L   Chloride 103 96 - 112 mEq/L   CO2 32 19 - 32 mEq/L   Glucose, Bld 92 70 - 99 mg/dL   BUN 20 6 - 23 mg/dL   Creatinine, Ser 5.63 0.40 - 1.20 mg/dL   Total Bilirubin 0.6 0.2 - 1.2 mg/dL   Alkaline Phosphatase 42 39 - 117 U/L   AST 24 0 - 37 U/L   ALT 24 0 - 35 U/L   Total Protein 7.1 6.0 - 8.3 g/dL   Albumin 4.8 3.5 - 5.2 g/dL   GFR 14.97 >02.63 mL/min   Calcium 9.8 8.4 - 10.5 mg/dL  Lipid panel  Result Value Ref Range   Cholesterol 203 (H) 0 - 200 mg/dL   Triglycerides 78.5 0.0 - 149.0 mg/dL   HDL 88.50 >27.74 mg/dL   VLDL 12.8 0.0 - 78.6 mg/dL   LDL Cholesterol 767 (H) 0 - 99 mg/dL   Total CHOL/HDL Ratio 3    NonHDL 132.78     This visit occurred during the SARS-CoV-2 public health emergency.  Safety protocols were in place, including screening questions prior to the visit, additional usage of staff PPE, and extensive cleaning of exam room while observing appropriate contact time as indicated for disinfecting solutions.   COVID 19 screen:  No recent travel or known exposure to COVID19 The patient denies respiratory symptoms of COVID 19 at this time. The importance of social distancing was discussed today.   Assessment and Plan The patient's preventative maintenance and recommended screening tests for an annual wellness exam were reviewed in full  today. Brought up to date unless services declined.  Counselled on the importance of diet, exercise, and its role in overall health and mortality. The patient's FH and SH was reviewed, including their home life, tobacco status, and drug and alcohol status.     Vaccines: Tdap,  COVID  Booster planned 90 days after MAB infusion, refused flu, Consider Shingrix. Colon: 12/2020 per pt  [facility], repeat in 5 years. Mammo: nml 01/2022 PAP/DVE: Nml pap 2018, no HPV, q 5 year,  No family history of ovarian cancer  DUE Nonsmoker.  ETOH: none now, usually 1 glass daily. HIV: refused Hep C: neg  Plan DEXA age 77 low risk.  Problem List Items Addressed This Visit     Essential hypertension, benign (Chronic)    At goal on losartan HCTZ 50/12.5 mg daily      HYPERCHOLESTEROLEMIA (Chronic)    Stable, chronic.  Continue current medication.    pravastatin 20 mg daily  Hypothyroidism (Chronic)    Stable, chronic.  Continue current medication.   Levo 50 mcg daily      Other Visit Diagnoses     Routine general medical examination at a health care facility    -  Primary       Kerby NoraAmy Coben Godshall, MD

## 2022-02-06 NOTE — Assessment & Plan Note (Signed)
At goal on losartan HCTZ 50/12.5 mg daily ?

## 2022-02-06 NOTE — Assessment & Plan Note (Signed)
Stable, chronic.  Continue current medication.    pravastatin 20 mg daily 

## 2022-02-06 NOTE — Patient Instructions (Signed)
Keep working on exercise, weight loss, healthy eating habits. ? ?

## 2022-02-06 NOTE — Assessment & Plan Note (Signed)
Stable, chronic.  Continue current medication.   Levo 50 mcg daily 

## 2022-02-06 NOTE — Addendum Note (Signed)
Addended by: Loreen Freud on: 02/06/2022 11:35 AM ? ? Modules accepted: Orders ? ?

## 2022-02-07 LAB — CYTOLOGY - PAP
Comment: NEGATIVE
Diagnosis: NEGATIVE
High risk HPV: NEGATIVE

## 2022-03-05 ENCOUNTER — Ambulatory Visit: Payer: BC Managed Care – PPO

## 2022-03-05 ENCOUNTER — Encounter: Payer: Self-pay | Admitting: Podiatry

## 2022-03-05 ENCOUNTER — Ambulatory Visit: Payer: BC Managed Care – PPO | Admitting: Podiatry

## 2022-03-05 DIAGNOSIS — M778 Other enthesopathies, not elsewhere classified: Secondary | ICD-10-CM | POA: Diagnosis not present

## 2022-03-05 DIAGNOSIS — M7751 Other enthesopathy of right foot: Secondary | ICD-10-CM

## 2022-03-05 NOTE — Progress Notes (Signed)
?Subjective:  ?Patient ID: Summer Hernandez, female    DOB: Jun 11, 1961,  MRN: [id number] ?HPI ?Chief Complaint  ?Patient presents with  ? Foot Pain  ?  Dorsal midfoot right - aching x 6 weeks, knot, hurts to walk a lot  ? New Patient (Initial Visit)  ? ? ?61 y.o. female presents with the above complaint.  ? ?ROS: Denies fever chills nausea vomiting muscle aches pains calf pain back pain chest pain shortness of breath. ? ?She is a friend of Samuel Jester ? ?Past Medical History:  ?Diagnosis Date  ? Hyperlipidemia   ? Hypertension   ? Shortness of breath dyspnea   ? ?Past Surgical History:  ?Procedure Laterality Date  ? CESAREAN SECTION    ? CHOLECYSTECTOMY, LAPAROSCOPIC    ? COLONOSCOPY N/A 04/27/2015  ? Procedure: COLONOSCOPY;  Surgeon: Manya Silvas, MD;  Location: [facility];  Service: Endoscopy;  Laterality: N/A;  ? PLANTAR FASCIA SURGERY Right 2012  ? plantar fascitis    ? ? ?Current Outpatient Medications:  ?  Apoaequorin (PREVAGEN PO), Take 1 tablet by mouth daily., Disp: , Rfl:  ?  aspirin 81 MG EC tablet, , Disp: , Rfl:  ?  Calcium Carbonate-Vitamin D (CALCIUM-VITAMIN D) 600-200 MG-UNIT CAPS, Take 1 capsule by mouth 2 (two) times daily., Disp: , Rfl:  ?  diphenhydrAMINE (SOMINEX) 25 MG tablet, Take 25 mg by mouth at bedtime as needed., Disp: , Rfl:  ?  levothyroxine (SYNTHROID) 50 MCG tablet, TAKE 1 TABLET BY MOUTH EVERY DAY, Disp: 90 tablet, Rfl: 0 ?  losartan-hydrochlorothiazide (HYZAAR) 50-12.5 MG tablet, TAKE 1 TABLET BY MOUTH EVERY DAY, Disp: 90 tablet, Rfl: 3 ?  Misc Natural Products (GLUCOSAMINE CHOND CMP ADVANCED) TABS, , Disp: , Rfl:  ?  omeprazole (PRILOSEC) 20 MG capsule, Take 20 mg by mouth every other day. , Disp: , Rfl:  ?  pravastatin (PRAVACHOL) 20 MG tablet, TAKE 1 TABLET BY MOUTH EVERY DAY, Disp: 90 tablet, Rfl: 3 ?  SOOLANTRA 1 % CREA, Apply topically., Disp: , Rfl:  ? ?Allergies  ?Allergen Reactions  ? Bee Venom Rash and Swelling  ? ?Review of Systems ?Objective:  ?There were no  vitals filed for this visit. ? ?General: Well developed, nourished, in no acute distress, alert and oriented x3  ? ?Dermatological: Skin is warm, dry and supple bilateral. Nails x 10 are well maintained; remaining integument appears unremarkable at this time. There are no open sores, no preulcerative lesions, no rash or signs of infection present. ? ?Vascular: Dorsalis Pedis artery and Posterior Tibial artery pedal pulses are 2/4 bilateral with immedate capillary fill time. Pedal hair growth present. No varicosities and no lower extremity edema present bilateral.  ? ?Neruologic: Grossly intact via light touch bilateral. Vibratory intact via tuning fork bilateral. Protective threshold with Semmes Wienstein monofilament intact to all pedal sites bilateral. Patellar and Achilles deep tendon reflexes 2+ bilateral. No Babinski or clonus noted bilateral.  ? ?Musculoskeletal: No gross boney pedal deformities bilateral. No pain, crepitus, or limitation noted with foot and ankle range of motion bilateral. Muscular strength 5/5 in all groups tested bilateral.  Mild tenderness on palpation of the tarsometatarsal joints dorsal aspect right foot.  Some pain on frontal plane range of motion. ? ?Gait: Unassisted, Nonantalgic.  ? ? ?Radiographs: ? ?Radiographs lateral view demonstrate nicely joint space narrowing subchondral sclerosis of the tarsometatarsal joints particularly the second and third of the right foot.  No fractures are identified. ? ?Assessment & Plan:  ? 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 ? ?Plan: Discussed topical anti-inflammatory cream discussed injection therapy discussed appropriate shoe gear and lacing styles and I will follow-up with her as needed. ? ? ? ? ?Valerie Cones T. Humboldt, DPM ?

## 2022-03-06 ENCOUNTER — Ambulatory Visit: Payer: BC Managed Care – PPO | Admitting: Podiatry

## 2022-04-11 ENCOUNTER — Other Ambulatory Visit: Payer: Self-pay | Admitting: Family Medicine

## 2022-04-14 ENCOUNTER — Other Ambulatory Visit: Payer: Self-pay | Admitting: Family Medicine

## 2022-05-27 ENCOUNTER — Other Ambulatory Visit: Payer: Self-pay | Admitting: Family Medicine

## 2022-08-07 ENCOUNTER — Ambulatory Visit: Payer: BC Managed Care – PPO | Admitting: Family Medicine

## 2022-08-07 ENCOUNTER — Encounter: Payer: Self-pay | Admitting: Family Medicine

## 2022-08-07 VITALS — BP 112/80 | HR 67 | Temp 98.2°F | Ht 63.0 in | Wt 183.1 lb

## 2022-08-07 DIAGNOSIS — M25561 Pain in right knee: Secondary | ICD-10-CM

## 2022-08-07 MED ORDER — DICLOFENAC SODIUM 75 MG PO TBEC: 75.0000 mg | DELAYED_RELEASE_TABLET | Freq: Two times a day (BID) | ORAL | 0 refills | Status: DC

## 2022-08-07 NOTE — Patient Instructions (Addendum)
Start diclofenac twice daily x 2 weeks.   Apply ice and elevation.  Limit knee bending, squatting.  Follow up if not improving as expected over 2-4 weeks.  Can use topical Voltaren gel after oral completed prn.

## 2022-08-07 NOTE — Progress Notes (Signed)
Patient ID: Summer Hernandez, female    DOB: [birth year], 61 y.o.   MRN: [id number]  This visit was conducted in person.  BP 112/80   Pulse 67   Temp 98.2 F (36.8 C) (Oral)   Ht 5\' 3"  (1.6 m)   Wt 183 lb 2 oz (83.1 kg)   SpO2 98%   BMI 32.44 kg/m    CC:  Chief Complaint  Patient presents with   Knee Pain    Right-Started Tuesday afternoon    Subjective:   HPI: Summer Hernandez is a 61 y.o. female  with history of  trochanteric bursitis on left, HTN and hypothyroid presenting on 08/07/2022 for Knee Pain (Right-Started Tuesday afternoon)  New onset pain in right knee x 2 days.   She has been during lifts and exercise.. pain and swelling started  the day after ' leg day".  No fall, no pop noted.   No redness, increase  in swelling laterally, slight increase in warmth. Pain in lateral joint line.  Noted popping with walking, no giving out.   Pain increased with stairs.   Using glucosamine in past for mild pain in right knee chronically.  No past knee surgery.   Tylenol helped some with pain.       Relevant past medical, surgical, family and social history reviewed and updated as indicated. Interim medical history since our last visit reviewed. Allergies and medications reviewed and updated. Outpatient Medications Prior to Visit  Medication Sig Dispense Refill   aspirin 81 MG EC tablet      Calcium Carbonate-Vitamin D (CALCIUM-VITAMIN D) 600-200 MG-UNIT CAPS Take 1 capsule by mouth 2 (two) times daily.     diphenhydrAMINE (SOMINEX) 25 MG tablet Take 25 mg by mouth at bedtime as needed.     levothyroxine (SYNTHROID) 50 MCG tablet TAKE 1 TABLET BY MOUTH EVERY DAY 90 tablet 3   losartan-hydrochlorothiazide (HYZAAR) 50-12.5 MG tablet TAKE 1 TABLET BY MOUTH EVERY DAY 90 tablet 3   Misc Natural Products (GLUCOSAMINE CHOND CMP ADVANCED) TABS      omeprazole (PRILOSEC) 20 MG capsule Take 20 mg by mouth every other day.      pravastatin (PRAVACHOL) 20 MG tablet TAKE 1 TABLET  BY MOUTH EVERY DAY 90 tablet 3   SOOLANTRA 1 % CREA Apply topically.     Apoaequorin (PREVAGEN PO) Take 1 tablet by mouth daily.     No facility-administered medications prior to visit.     Per HPI unless specifically indicated in ROS section below Review of Systems  Constitutional:  Negative for fatigue and fever.  HENT:  Negative for ear pain.   Eyes:  Negative for pain.  Respiratory:  Negative for chest tightness and shortness of breath.   Cardiovascular:  Negative for chest pain, palpitations and leg swelling.  Gastrointestinal:  Negative for abdominal pain.  Genitourinary:  Negative for dysuria.   Objective:  BP 112/80   Pulse 67   Temp 98.2 F (36.8 C) (Oral)   Ht 5\' 3"  (1.6 m)   Wt 183 lb 2 oz (83.1 kg)   SpO2 98%   BMI 32.44 kg/m   Wt Readings from Last 3 Encounters:  08/07/22 183 lb 2 oz (83.1 kg)  02/06/22 179 lb 9.6 oz (81.5 kg)  02/01/21 163 lb 8 oz (74.2 kg)      Physical Exam Constitutional:      General: She is not in acute distress.    Appearance: Normal appearance. She is well-developed.  She is not ill-appearing or toxic-appearing.  HENT:     Head: Normocephalic.     Right Ear: Hearing, tympanic membrane, ear canal and external ear normal. Tympanic membrane is not erythematous, retracted or bulging.     Left Ear: Hearing, tympanic membrane, ear canal and external ear normal. Tympanic membrane is not erythematous, retracted or bulging.     Nose: No mucosal edema or rhinorrhea.     Right Sinus: No maxillary sinus tenderness or frontal sinus tenderness.     Left Sinus: No maxillary sinus tenderness or frontal sinus tenderness.     Mouth/Throat:     Pharynx: Uvula midline.  Eyes:     General: Lids are normal. Lids are everted, no foreign bodies appreciated.     Conjunctiva/sclera: Conjunctivae normal.     Pupils: Pupils are equal, round, and reactive to light.  Neck:     Thyroid: No thyroid mass or thyromegaly.     Vascular: No carotid bruit.      Trachea: Trachea normal.  Cardiovascular:     Rate and Rhythm: Normal rate and regular rhythm.     Pulses: Normal pulses.     Heart sounds: Normal heart sounds, S1 normal and S2 normal. No murmur heard.    No friction rub. No gallop.  Pulmonary:     Effort: Pulmonary effort is normal. No tachypnea or respiratory distress.     Breath sounds: Normal breath sounds. No decreased breath sounds, wheezing, rhonchi or rales.  Abdominal:     General: Bowel sounds are normal.     Palpations: Abdomen is soft.     Tenderness: There is no abdominal tenderness.  Musculoskeletal:     Cervical back: Normal range of motion and neck supple.     Right knee: No effusion or erythema. Normal range of motion. Tenderness present over the lateral joint line. No medial joint line, MCL, LCL, ACL, PCL or patellar tendon tenderness.     Left knee: Normal.  Skin:    General: Skin is warm and dry.     Findings: No rash.  Neurological:     Mental Status: She is alert.  Psychiatric:        Mood and Affect: Mood is not anxious or depressed.        Speech: Speech normal.        Behavior: Behavior normal. Behavior is cooperative.        Thought Content: Thought content normal.        Judgment: Judgment normal.       Results for orders placed or performed in visit on 02/06/22  Cytology - PAP  Result Value Ref Range   High risk HPV Negative    Adequacy      Satisfactory for evaluation; transformation zone component PRESENT.   Diagnosis      - Negative for intraepithelial lesion or malignancy (NILM)   Comment Normal Reference Range HPV - Negative      COVID 19 screen:  No recent travel or known exposure to COVID19 The patient denies respiratory symptoms of COVID 19 at this time. The importance of social distancing was discussed today.   Assessment and Plan    Problem List Items Addressed This Visit     Acute pain of right knee - Primary    Acute, most likely flare of osteoarthritis.  No sign of clear  meniscal tear or ligament injury.  Treat with ice, elevation and anti-inflammatories.  Avoid repetitive bending, squatting.  Follow-up in 2 weeks  if not improving as expected.  Consider x-ray at that point, but none indicated per exam today.        Kerby Nora, MD

## 2022-08-07 NOTE — Assessment & Plan Note (Signed)
Acute, most likely flare of osteoarthritis.  No sign of clear meniscal tear or ligament injury.  Treat with ice, elevation and anti-inflammatories.  Avoid repetitive bending, squatting.  Follow-up in 2 weeks if not improving as expected.  Consider x-ray at that point, but none indicated per exam today.

## 2022-08-21 ENCOUNTER — Ambulatory Visit: Payer: BC Managed Care – PPO | Admitting: Family Medicine

## 2022-08-21 ENCOUNTER — Encounter: Payer: Self-pay | Admitting: Family Medicine

## 2022-08-21 ENCOUNTER — Ambulatory Visit (INDEPENDENT_AMBULATORY_CARE_PROVIDER_SITE_OTHER)
Admission: RE | Admit: 2022-08-21 | Discharge: 2022-08-21 | Disposition: A | Discharge status: Home / Self Care | Payer: BC Managed Care – PPO | Source: Ambulatory Visit | Attending: Family Medicine | Admitting: Family Medicine

## 2022-08-21 VITALS — BP 100/70 | HR 69 | Temp 98.2°F | Ht 63.0 in | Wt 186.1 lb

## 2022-08-21 DIAGNOSIS — M25561 Pain in right knee: Secondary | ICD-10-CM

## 2022-08-21 NOTE — Progress Notes (Signed)
Patient ID: Summer Hernandez, female    DOB: 1961-12-04, 61 y.o.   MRN: [id number]  This visit was conducted in person.  BP 100/70   Pulse 69   Temp 98.2 F (36.8 C) (Oral)   Ht 5\' 3"  (1.6 m)   Wt 186 lb 2 oz (84.4 kg)   SpO2 96%   BMI 32.97 kg/m    CC:  Chief Complaint  Patient presents with   Follow-up    Right Knee Pain    Subjective:   HPI: Summer Hernandez is a 61 y.o. female presenting on 08/21/2022 for Follow-up (Right Knee Pain)  Reviewed last OV from 08/07/2022.  At that office visit felt most likely a flare of osteoarthritis.  There was no clear sign of meniscal tear or injury.  We treated with ice, elevation and anti-inflammatories.   She reports she is continuing to have pain going up stairs.   Waking is not painful.  She has noted popping. No catching. She has been using diclofenac 75 mg BID, and icing intermittently. Continues to note bulging on lateral knee.  No redness, no heat.   She did do jet skiing 4 days later before the pain started , no fall.       Relevant past medical, surgical, family and social history reviewed and updated as indicated. Interim medical history since our last visit reviewed. Allergies and medications reviewed and updated. Outpatient Medications Prior to Visit  Medication Sig Dispense Refill   aspirin 81 MG EC tablet      Calcium Carbonate-Vitamin D (CALCIUM-VITAMIN D) 600-200 MG-UNIT CAPS Take 1 capsule by mouth 2 (two) times daily.     diclofenac (VOLTAREN) 75 MG EC tablet Take 1 tablet (75 mg total) by mouth 2 (two) times daily. 30 tablet 0   diphenhydrAMINE (SOMINEX) 25 MG tablet Take 25 mg by mouth at bedtime as needed.     levothyroxine (SYNTHROID) 50 MCG tablet TAKE 1 TABLET BY MOUTH EVERY DAY 90 tablet 3   losartan-hydrochlorothiazide (HYZAAR) 50-12.5 MG tablet TAKE 1 TABLET BY MOUTH EVERY DAY 90 tablet 3   Misc Natural Products (GLUCOSAMINE CHOND CMP ADVANCED) TABS      omeprazole (PRILOSEC) 20 MG capsule Take 20 mg  by mouth every other day.      pravastatin (PRAVACHOL) 20 MG tablet TAKE 1 TABLET BY MOUTH EVERY DAY 90 tablet 3   SOOLANTRA 1 % CREA Apply topically.     No facility-administered medications prior to visit.     Per HPI unless specifically indicated in ROS section below Review of Systems  Constitutional:  Negative for fatigue and fever.  HENT:  Negative for congestion.   Eyes:  Negative for pain.  Respiratory:  Negative for cough and shortness of breath.   Cardiovascular:  Negative for chest pain, palpitations and leg swelling.  Gastrointestinal:  Negative for abdominal pain.  Genitourinary:  Negative for dysuria and vaginal bleeding.  Musculoskeletal:  Negative for back pain.  Neurological:  Negative for syncope, light-headedness and headaches.  Psychiatric/Behavioral:  Negative for dysphoric mood.    Objective:  BP 100/70   Pulse 69   Temp 98.2 F (36.8 C) (Oral)   Ht 5\' 3"  (1.6 m)   Wt 186 lb 2 oz (84.4 kg)   SpO2 96%   BMI 32.97 kg/m   Wt Readings from Last 3 Encounters:  08/21/22 186 lb 2 oz (84.4 kg)  08/07/22 183 lb 2 oz (83.1 kg)  02/06/22 179 lb 9.6 oz (  81.5 kg)      Physical Exam Constitutional:      General: She is not in acute distress.    Appearance: Normal appearance. She is well-developed. She is not ill-appearing or toxic-appearing.  HENT:     Head: Normocephalic.     Right Ear: Hearing, tympanic membrane, ear canal and external ear normal. Tympanic membrane is not erythematous, retracted or bulging.     Left Ear: Hearing, tympanic membrane, ear canal and external ear normal. Tympanic membrane is not erythematous, retracted or bulging.     Nose: No mucosal edema or rhinorrhea.     Right Sinus: No maxillary sinus tenderness or frontal sinus tenderness.     Left Sinus: No maxillary sinus tenderness or frontal sinus tenderness.     Mouth/Throat:     Pharynx: Uvula midline.  Eyes:     General: Lids are normal. Lids are everted, no foreign bodies  appreciated.     Conjunctiva/sclera: Conjunctivae normal.     Pupils: Pupils are equal, round, and reactive to light.  Neck:     Thyroid: No thyroid mass or thyromegaly.     Vascular: No carotid bruit.     Trachea: Trachea normal.  Cardiovascular:     Rate and Rhythm: Normal rate and regular rhythm.     Pulses: Normal pulses.     Heart sounds: Normal heart sounds, S1 normal and S2 normal. No murmur heard.    No friction rub. No gallop.  Pulmonary:     Effort: Pulmonary effort is normal. No tachypnea or respiratory distress.     Breath sounds: Normal breath sounds. No decreased breath sounds, wheezing, rhonchi or rales.  Abdominal:     General: Bowel sounds are normal.     Palpations: Abdomen is soft.     Tenderness: There is no abdominal tenderness.  Musculoskeletal:     Cervical back: Normal range of motion and neck supple.     Right knee: Swelling and crepitus present. No erythema or ecchymosis. Decreased range of motion. Tenderness present over the lateral joint line. No medial joint line, MCL, LCL, ACL, PCL or patellar tendon tenderness. No LCL laxity, MCL laxity, ACL laxity or PCL laxity. Abnormal meniscus. Normal patellar mobility. Normal pulse.     Instability Tests: Anterior drawer test negative. Posterior drawer test negative. Anterior Lachman test negative. Medial McMurray test positive. Lateral McMurray test negative.     Comments: Swelling superior medial on right knee.  Crepitus and now  pain and clicking with McMurray   Skin:    General: Skin is warm and dry.     Findings: No rash.  Neurological:     Mental Status: She is alert.  Psychiatric:        Mood and Affect: Mood is not anxious or depressed.        Speech: Speech normal.        Behavior: Behavior normal. Behavior is cooperative.        Thought Content: Thought content normal.        Judgment: Judgment normal.       Results for orders placed or performed in visit on 02/06/22  Cytology - PAP  Result Value  Ref Range   High risk HPV Negative    Adequacy      Satisfactory for evaluation; transformation zone component PRESENT.   Diagnosis      - Negative for intraepithelial lesion or malignancy (NILM)   Comment Normal Reference Range HPV - Negative  COVID 19 screen:  No recent travel or known exposure to COVID19 The patient denies respiratory symptoms of COVID 19 at this time. The importance of social distancing was discussed today.   Assessment and Plan    Problem List Items Addressed This Visit     Acute pain of right knee - Primary    Acute, no improvement with 2 weeks of diclofenac 75 mg p.o. twice daily, ice, elevation and gentle range of motion home physical therapy.  No remember as she did go jet skiing 4 days prior without known injury but possibly increased stress to the joint.  Today's exam has changed to be more concerning for meniscal tear although osteoarthritis is still possible. We will evaluate with x-ray of right knee and likely refer to either sports medicine or orthopedics.      Relevant Orders   DG Knee Complete 4 Views Right     Kerby Nora, MD

## 2022-08-21 NOTE — Assessment & Plan Note (Signed)
Acute, no improvement with 2 weeks of diclofenac 75 mg p.o. twice daily, ice, elevation and gentle range of motion home physical therapy.  No remember as she did go jet skiing 4 days prior without known injury but possibly increased stress to the joint.  Today's exam has changed to be more concerning for meniscal tear although osteoarthritis is still possible. We will evaluate with x-ray of right knee and likely refer to either sports medicine or orthopedics.

## 2022-08-22 ENCOUNTER — Telehealth: Payer: Self-pay | Admitting: Family Medicine

## 2022-08-22 NOTE — Telephone Encounter (Signed)
Fatimata notified by telephone that x-ray is still pending radiology read.

## 2022-08-22 NOTE — Telephone Encounter (Signed)
Patient called in to follow up on x-ray from yesterday. Thank you!

## 2022-08-27 ENCOUNTER — Ambulatory Visit: Payer: BC Managed Care – PPO | Admitting: Family Medicine

## 2022-10-15 ENCOUNTER — Encounter: Payer: Self-pay | Admitting: Podiatry

## 2022-10-15 ENCOUNTER — Ambulatory Visit (INDEPENDENT_AMBULATORY_CARE_PROVIDER_SITE_OTHER): Payer: BC Managed Care – PPO

## 2022-10-15 ENCOUNTER — Ambulatory Visit: Payer: BC Managed Care – PPO | Admitting: Podiatry

## 2022-10-15 DIAGNOSIS — M722 Plantar fascial fibromatosis: Secondary | ICD-10-CM

## 2022-10-15 MED ORDER — TRIAMCINOLONE ACETONIDE 40 MG/ML IJ SUSP
20.0000 mg | Freq: Once | INTRAMUSCULAR | Status: AC
  Administered 2022-10-15: 20 mg

## 2022-10-15 NOTE — Progress Notes (Signed)
Shunt presents today after having not seen her since March with a chief complaint of plantar heel pain.  States that this is a new problem is only been present for the past several months has been aching in the mornings that she says sometimes it radiates up the back of the heel she has been trying to take nonsteroidal anti-inflammatories for other problems and it really has not helped that much with this though it has subsided some over the past few months.  Objective: Vital signs are stable alert and oriented x3.  Pulses are palpable.  She has pain on palpation medial calcaneal tubercle.  Radiographs taken today demonstrate significant spurring plantarly posteriorly she has a large os perineum.  Soft tissue increase in density plantar fascial calcaneal insertion site with some calcification of the plantar fascia consistent with old tears.  Assessment: Planter fasciitis left.  Plan: I injected the left heel today 20 mg Kenalog 5 mg Marcaine point of maximal tenderness.  Tolerated procedure well without complications.  I do not think we need to start her on any oral medication at this point since she is improving however I will follow-up with her as needed.  We did do surgery on her contralateral foot for the same problem several years ago

## 2022-11-12 ENCOUNTER — Ambulatory Visit: Payer: BC Managed Care – PPO | Admitting: Podiatry

## 2022-11-12 ENCOUNTER — Encounter: Payer: Self-pay | Admitting: Podiatry

## 2022-11-12 DIAGNOSIS — M722 Plantar fascial fibromatosis: Secondary | ICD-10-CM | POA: Diagnosis not present

## 2022-11-12 MED ORDER — TRIAMCINOLONE ACETONIDE 40 MG/ML IJ SUSP
20.0000 mg | Freq: Once | INTRAMUSCULAR | Status: AC
  Administered 2022-11-12: 20 mg

## 2022-11-12 NOTE — Progress Notes (Signed)
She presents today for follow-up of her Planter fasciitis she states that is better about 50% improved but is starting to feel pain at the back of the heel.  She states that she is on her forefoot a lot trying to stay off the heel so it would do better but now has started to develop radiating pain proximally.  Objective: Vital signs are stable she is alert and oriented x 3 pulses are palpable.  There is no erythema edema cellulitis drainage or odor much decrease in edema much decrease in firmness that she still has pain on palpation medial calcaneal tubercle of the left heel.  Assessment: Most likely compensatory posterior heel tenderness secondary to plantar fasciitis medial band left foot.  Plan: At this point continue all conservative therapies dispensed a plantar fascial night splint reinjected the left heel and she will stay in her good shoes at all times follow-up with me in 6 weeks

## 2022-12-03 ENCOUNTER — Ambulatory Visit: Payer: BC Managed Care – PPO | Admitting: Podiatry

## 2022-12-09 ENCOUNTER — Other Ambulatory Visit: Payer: Self-pay | Admitting: Family Medicine

## 2022-12-09 ENCOUNTER — Encounter: Payer: Self-pay | Admitting: Family Medicine

## 2022-12-09 DIAGNOSIS — Z1231 Encounter for screening mammogram for malignant neoplasm of breast: Secondary | ICD-10-CM

## 2022-12-09 DIAGNOSIS — E2839 Other primary ovarian failure: Secondary | ICD-10-CM

## 2022-12-31 ENCOUNTER — Ambulatory Visit: Payer: BC Managed Care – PPO | Admitting: Podiatry

## 2022-12-31 ENCOUNTER — Encounter: Payer: Self-pay | Admitting: Podiatry

## 2022-12-31 VITALS — BP 144/92 | HR 80

## 2022-12-31 DIAGNOSIS — S93692A Other sprain of left foot, initial encounter: Secondary | ICD-10-CM

## 2022-12-31 NOTE — Progress Notes (Signed)
Summer Hernandez presents today chief complaint of continued plantar fascial pain left foot.  She states that right now is feeling better but by the end of the day will be so severe that it can make me nauseous.  She states that she went shopping with a friend and she was sick the pain that she was experiencing.  She has discontinued her Celebrex that she was taking for her knee but is thinking about starting the Celebrex again.  Objective: Vital signs are stable she is alert and oriented x 3 pulses are palpable.  She has pain on palpation of the plantar medial fascial band at its insertion site on the calcaneus.  The area is warm to touch.  She has no Achilles pain but she does have pain to the lateral aspect fourth fifth tarsometatarsal area.  Assessment: Highly suspect partial plantar fascial tear.  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  Plan: Discussed etiology pathology conservative surgical therapies at this point regarding request an MRI for differential diagnoses of the left rear foot and possible surgical intervention.  She will follow-up with [place] once we obtain the results of the MRI.

## 2023-01-08 ENCOUNTER — Encounter: Payer: Self-pay | Admitting: Podiatry

## 2023-01-09 ENCOUNTER — Ambulatory Visit
Admission: RE | Admit: 2023-01-09 | Discharge: 2023-01-09 | Disposition: A | Discharge status: Home / Self Care | Payer: BC Managed Care – PPO | Source: Ambulatory Visit | Attending: Podiatry | Admitting: Podiatry

## 2023-01-09 DIAGNOSIS — S93692A Other sprain of left foot, initial encounter: Secondary | ICD-10-CM

## 2023-01-10 ENCOUNTER — Other Ambulatory Visit: Payer: Self-pay | Admitting: Family Medicine

## 2023-01-22 ENCOUNTER — Ambulatory Visit
Admission: RE | Admit: 2023-01-22 | Discharge: 2023-01-22 | Disposition: A | Discharge status: Home / Self Care | Payer: BC Managed Care – PPO | Source: Ambulatory Visit | Attending: Family Medicine | Admitting: Family Medicine

## 2023-01-22 DIAGNOSIS — Z1231 Encounter for screening mammogram for malignant neoplasm of breast: Secondary | ICD-10-CM | POA: Diagnosis present

## 2023-02-02 ENCOUNTER — Ambulatory Visit
Admission: RE | Admit: 2023-02-02 | Discharge: 2023-02-02 | Disposition: A | Discharge status: Home / Self Care | Payer: BC Managed Care – PPO | Source: Ambulatory Visit | Attending: Family Medicine | Admitting: Family Medicine

## 2023-02-02 DIAGNOSIS — E2839 Other primary ovarian failure: Secondary | ICD-10-CM | POA: Insufficient documentation

## 2023-02-03 ENCOUNTER — Other Ambulatory Visit (INDEPENDENT_AMBULATORY_CARE_PROVIDER_SITE_OTHER): Payer: BC Managed Care – PPO

## 2023-02-03 ENCOUNTER — Telehealth: Payer: Self-pay | Admitting: Family Medicine

## 2023-02-03 DIAGNOSIS — E039 Hypothyroidism, unspecified: Secondary | ICD-10-CM

## 2023-02-03 DIAGNOSIS — E7800 Pure hypercholesterolemia, unspecified: Secondary | ICD-10-CM

## 2023-02-03 LAB — LIPID PANEL
Cholesterol: 191 mg/dL (ref 0–200)
HDL: 70.6 mg/dL (ref >39.00)
LDL Cholesterol: 108 mg/dL — ABNORMAL HIGH (ref 0–99)
NonHDL: 120.59
Total CHOL/HDL Ratio: 3
Triglycerides: 64 mg/dL (ref 0.0–149.0)
VLDL: 12.8 mg/dL (ref 0.0–40.0)

## 2023-02-03 LAB — COMPREHENSIVE METABOLIC PANEL
ALT: 13 U/L (ref 0–35)
AST: 16 U/L (ref 0–37)
Albumin: 4.5 g/dL (ref 3.5–5.2)
Alkaline Phosphatase: 40 U/L (ref 39–117)
BUN: 19 mg/dL (ref 6–23)
CO2: 30 mEq/L (ref 19–32)
Calcium: 10.6 mg/dL — ABNORMAL HIGH (ref 8.4–10.5)
Chloride: 101 mEq/L (ref 96–112)
Creatinine, Ser: 0.88 mg/dL (ref 0.40–1.20)
GFR: 70.02 mL/min (ref >60.00)
Glucose, Bld: 89 mg/dL (ref 70–99)
Potassium: 4.3 mEq/L (ref 3.5–5.1)
Sodium: 140 mEq/L (ref 135–145)
Total Bilirubin: 0.6 mg/dL (ref 0.2–1.2)
Total Protein: 7.1 g/dL (ref 6.0–8.3)

## 2023-02-03 LAB — T3, FREE: T3, Free: 2.4 pg/mL (ref 2.3–4.2)

## 2023-02-03 LAB — T4, FREE: Free T4: 0.89 ng/dL (ref 0.60–1.60)

## 2023-02-03 LAB — TSH: TSH: 4.37 u[IU]/mL (ref 0.35–5.50)

## 2023-02-03 NOTE — Telephone Encounter (Signed)
-----   Message from Velna Hatchet, RT sent at 01/20/2023  4:03 PM EST ----- Regarding: Tue 2/27 lab Patient is scheduled for cpx, please order future labs.  Thanks, Anda Kraft

## 2023-02-03 NOTE — Progress Notes (Signed)
No critical labs need to be addressed urgently. We will discuss labs in detail at upcoming office visit.   

## 2023-02-10 ENCOUNTER — Ambulatory Visit (INDEPENDENT_AMBULATORY_CARE_PROVIDER_SITE_OTHER): Payer: BC Managed Care – PPO | Admitting: Family Medicine

## 2023-02-10 ENCOUNTER — Encounter: Payer: Self-pay | Admitting: Family Medicine

## 2023-02-10 VITALS — BP 110/80 | HR 48 | Temp 97.4°F | Ht 62.5 in | Wt 183.4 lb

## 2023-02-10 DIAGNOSIS — E039 Hypothyroidism, unspecified: Secondary | ICD-10-CM

## 2023-02-10 DIAGNOSIS — Z0000 Encounter for general adult medical examination without abnormal findings: Secondary | ICD-10-CM | POA: Diagnosis not present

## 2023-02-10 DIAGNOSIS — I10 Essential (primary) hypertension: Secondary | ICD-10-CM

## 2023-02-10 DIAGNOSIS — E7800 Pure hypercholesterolemia, unspecified: Secondary | ICD-10-CM

## 2023-02-10 NOTE — Assessment & Plan Note (Addendum)
Stable, chronic.  Continue current medication.  At goal on losartan HCTZ 50/12.5 mg daily

## 2023-02-10 NOTE — Assessment & Plan Note (Signed)
Stable, chronic.  Continue current medication.    pravastatin 20 mg daily

## 2023-02-10 NOTE — Assessment & Plan Note (Signed)
Stable, chronic.  Continue current medication.   Levo 50 mcg daily

## 2023-02-10 NOTE — Progress Notes (Signed)
Patient ID: YUKTHA CULLIN, female    DOB: June 17, 1961, 62 y.o.   MRN: [id number]  This visit was conducted in person.  BP 110/80   Pulse (!) 48   Temp (!) 97.4 F (36.3 C) (Temporal)   Ht 5' 2.5" (1.588 m)   Wt 183 lb 6 oz (83.2 kg)   SpO2 98%   BMI 33.01 kg/m    CC:  Chief Complaint  Patient presents with   Annual Exam    Subjective:   HPI: Anaeli Kempen Sidoti is a 62 y.o. female presenting on 02/10/2023 for Annual Exam  Right knee pain..  Emerge, S/P steroid injection .Marland Kitchen Unhelpful.  MRI showed lateral meniscus tear and OA... treated with PT.  Hypertension:     At goal on losartan HCTZ 50/12.5 mg daily BP Readings from Last 3 Encounters:  02/10/23 110/80  12/31/22 (!) 144/92  08/21/22 100/70  Using medication without problems or lightheadedness:  none Chest pain with exertion: none Edema:none Short of breath:none Average home BPs: Other issues:  Elevated Cholesterol:  Good control on pravastatin 20 mg daily Lab Results  Component Value Date   CHOL 191 02/03/2023   HDL 70.60 02/03/2023   LDLCALC 108 (H) 02/03/2023   LDLDIRECT 130.9 11/21/2013   TRIG 64.0 02/03/2023   CHOLHDL 3 02/03/2023  Using medications without problems: none Muscle aches:  none Diet compliance: moderate.. she his now back on  track Exercise: limited given foot issue.. seeing Podiatry. Other complaints: Wt Readings from Last 3 Encounters:  02/10/23 183 lb 6 oz (83.2 kg)  08/21/22 186 lb 2 oz (84.4 kg)  08/07/22 183 lb 2 oz (83.1 kg)   The 10-year ASCVD risk score (Arnett DK, et al., 2019) is: 3.8%   Values used to calculate the score:     Age: 21 years     Sex: Female     Is Non-Hispanic African American: No     Diabetic: No     Tobacco smoker: No     Systolic Blood Pressure: [id number] mmHg     Is BP treated: Yes     HDL Cholesterol: 70.6 mg/dL     Total Cholesterol: 191 mg/dL   Hypothyroid  good energy overall. Levo 50 mcg daily Lab Results  Component Value Date   TSH 4.37  02/03/2023      Discussed insomnia and healthy sleep hygiene.. she will continue benadryl and add melatonin. Restart exercise if able.  Relevant past medical, surgical, family and social history reviewed and updated as indicated. Interim medical history since our last visit reviewed. Allergies and medications reviewed and updated. Outpatient Medications Prior to Visit  Medication Sig Dispense Refill   aspirin 81 MG EC tablet      Calcium Carbonate-Vitamin D (CALCIUM-VITAMIN D) 600-200 MG-UNIT CAPS Take 1 capsule by mouth 2 (two) times daily.     diphenhydrAMINE (SOMINEX) 25 MG tablet Take 25 mg by mouth at bedtime as needed.     levothyroxine (SYNTHROID) 50 MCG tablet TAKE 1 TABLET BY MOUTH EVERY DAY 90 tablet 3   losartan-hydrochlorothiazide (HYZAAR) 50-12.5 MG tablet TAKE 1 TABLET BY MOUTH EVERY DAY 90 tablet 3   Misc Natural Products (GLUCOSAMINE CHOND CMP ADVANCED) TABS      omeprazole (PRILOSEC) 20 MG capsule Take 20 mg by mouth every other day.      pravastatin (PRAVACHOL) 20 MG tablet TAKE 1 TABLET BY MOUTH EVERY DAY 90 tablet 3   SOOLANTRA 1 % CREA Apply topically.  celecoxib (CELEBREX) 200 MG capsule      No facility-administered medications prior to visit.     Per HPI unless specifically indicated in ROS section below Review of Systems  Constitutional:  Negative for fatigue and fever.  HENT:  Negative for congestion.   Eyes:  Negative for pain.  Respiratory:  Negative for cough and shortness of breath.   Cardiovascular:  Negative for chest pain, palpitations and leg swelling.  Gastrointestinal:  Negative for abdominal pain.  Genitourinary:  Negative for dysuria and vaginal bleeding.  Musculoskeletal:  Negative for back pain.  Neurological:  Negative for syncope, light-headedness and headaches.  Psychiatric/Behavioral:  Negative for dysphoric mood.    Objective:  BP 110/80   Pulse (!) 48   Temp (!) 97.4 F (36.3 C) (Temporal)   Ht 5' 2.5" (1.588 m)   Wt 183 lb 6  oz (83.2 kg)   SpO2 98%   BMI 33.01 kg/m   Wt Readings from Last 3 Encounters:  02/10/23 183 lb 6 oz (83.2 kg)  08/21/22 186 lb 2 oz (84.4 kg)  08/07/22 183 lb 2 oz (83.1 kg)      Physical Exam Vitals and nursing note reviewed.  Constitutional:      General: She is not in acute distress.    Appearance: Normal appearance. She is well-developed. She is not ill-appearing or toxic-appearing.  HENT:     Head: Normocephalic.     Right Ear: Hearing, tympanic membrane, ear canal and external ear normal.     Left Ear: Hearing, tympanic membrane, ear canal and external ear normal.     Nose: Nose normal.  Eyes:     General: Lids are normal. Lids are everted, no foreign bodies appreciated.     Conjunctiva/sclera: Conjunctivae normal.     Pupils: Pupils are equal, round, and reactive to light.  Neck:     Thyroid: No thyroid mass or thyromegaly.     Vascular: No carotid bruit.     Trachea: Trachea normal.  Cardiovascular:     Rate and Rhythm: Normal rate and regular rhythm.     Heart sounds: Normal heart sounds, S1 normal and S2 normal. No murmur heard.    No gallop.  Pulmonary:     Effort: Pulmonary effort is normal. No respiratory distress.     Breath sounds: Normal breath sounds. No wheezing, rhonchi or rales.  Abdominal:     General: Bowel sounds are normal. There is no distension or abdominal bruit.     Palpations: Abdomen is soft. There is no fluid wave or mass.     Tenderness: There is no abdominal tenderness. There is no guarding or rebound.     Hernia: No hernia is present.  Genitourinary:    Exam position: Supine.     Labia:        Right: No rash, tenderness or lesion.        Left: No rash, tenderness or lesion.      Vagina: Normal.     Cervix: No cervical motion tenderness, discharge or friability.     Uterus: Not enlarged and not tender.      Adnexa:        Right: No mass, tenderness or fullness.         Left: No mass, tenderness or fullness.    Musculoskeletal:      Cervical back: Normal range of motion and neck supple.  Lymphadenopathy:     Cervical: No cervical adenopathy.  Skin:    General:  Skin is warm and dry.     Findings: No rash.  Neurological:     Mental Status: She is alert.     Cranial Nerves: No cranial nerve deficit.     Sensory: No sensory deficit.  Psychiatric:        Mood and Affect: Mood is not anxious or depressed.        Speech: Speech normal.        Behavior: Behavior normal. Behavior is cooperative.        Judgment: Judgment normal.       Results for orders placed or performed in visit on 02/03/23  T3, free  Result Value Ref Range   T3, Free 2.4 2.3 - 4.2 pg/mL  T4, free  Result Value Ref Range   Free T4 0.89 0.60 - 1.60 ng/dL  TSH  Result Value Ref Range   TSH 4.37 0.35 - 5.50 uIU/mL  Comprehensive metabolic panel  Result Value Ref Range   Sodium 140 135 - 145 mEq/L   Potassium 4.3 3.5 - 5.1 mEq/L   Chloride 101 96 - 112 mEq/L   CO2 30 19 - 32 mEq/L   Glucose, Bld 89 70 - 99 mg/dL   BUN 19 6 - 23 mg/dL   Creatinine, Ser 0.88 0.40 - 1.20 mg/dL   Total Bilirubin 0.6 0.2 - 1.2 mg/dL   Alkaline Phosphatase 40 39 - 117 U/L   AST 16 0 - 37 U/L   ALT 13 0 - 35 U/L   Total Protein 7.1 6.0 - 8.3 g/dL   Albumin 4.5 3.5 - 5.2 g/dL   GFR 70.02 >60.00 mL/min   Calcium 10.6 (H) 8.4 - 10.5 mg/dL  Lipid panel  Result Value Ref Range   Cholesterol 191 0 - 200 mg/dL   Triglycerides 64.0 0.0 - 149.0 mg/dL   HDL 70.60 >39.00 mg/dL   VLDL 12.8 0.0 - 40.0 mg/dL   LDL Cholesterol 108 (H) 0 - 99 mg/dL   Total CHOL/HDL Ratio 3    NonHDL 120.59     This visit occurred during the SARS-CoV-2 public health emergency.  Safety protocols were in place, including screening questions prior to the visit, additional usage of staff PPE, and extensive cleaning of exam room while observing appropriate contact time as indicated for disinfecting solutions.   COVID 19 screen:  No recent travel or known exposure to COVID19 The patient  denies respiratory symptoms of COVID 19 at this time. The importance of social distancing was discussed today.   Assessment and Plan The patient's preventative maintenance and recommended screening tests for an annual wellness exam were reviewed in full today. Brought up to date unless services declined.  Counselled on the importance of diet, exercise, and its role in overall health and mortality. The patient's FH and SH was reviewed, including their home life, tobacco status, and drug and alcohol status.     Vaccines: Tdap Due,  COVID x 3, refused flu, Consider Shingrix. Colon: 12/2020 per pt  [facility], repeat in 5 years. Mammo: nml 01/2023 PAP/DVE: Nml pap 2018, no HPV, q 5 year,  No family history of ovarian cancer  Nonsmoker.  ETOH: occ HIV: refused Hep C: neg DeXA: 01/2023 normal.. repeat in 5 years.   Problem List Items Addressed This Visit     Essential hypertension, benign (Chronic)    Stable, chronic.  Continue current medication.  At goal on losartan HCTZ 50/12.5 mg daily      HYPERCHOLESTEROLEMIA (Chronic)  Stable, chronic.  Continue current medication.    pravastatin 20 mg daily      Hypothyroidism (Chronic)    Stable, chronic.  Continue current medication.   Levo 50 mcg daily      Other Visit Diagnoses     Routine general medical examination at a health care facility    -  Primary      Eliezer Lofts, MD

## 2023-02-10 NOTE — Patient Instructions (Signed)
Stop Calcium supplement.  Change Vit D3 1000 IU daily.

## 2023-02-11 ENCOUNTER — Encounter: Payer: Self-pay | Admitting: Podiatry

## 2023-02-11 ENCOUNTER — Ambulatory Visit: Payer: BC Managed Care – PPO | Admitting: Podiatry

## 2023-02-11 DIAGNOSIS — M722 Plantar fascial fibromatosis: Secondary | ICD-10-CM | POA: Diagnosis not present

## 2023-02-11 NOTE — Progress Notes (Signed)
She presents today for follow-up of her MRI.  States that the heel is doing a little bit better she refers to the left foot.  Objective: Vital signs are stable she is alert and oriented x 3.  Minimal pain on palpation of the left heel.  Subungual hematoma hallux right.  MRI: Demonstrating central band Planter fasciitis.  Assessment: Planter fasciitis central band left.  Plan: At this point with no tear I feel safe to send her for aggressive physical therapy regarding her left foot.

## 2023-02-23 ENCOUNTER — Other Ambulatory Visit: Payer: Self-pay | Admitting: Family Medicine

## 2023-03-30 ENCOUNTER — Ambulatory Visit: Payer: BC Managed Care – PPO | Admitting: Physical Therapy

## 2023-04-02 ENCOUNTER — Ambulatory Visit: Payer: BC Managed Care – PPO | Admitting: Physical Therapy

## 2023-04-06 ENCOUNTER — Encounter: Payer: BC Managed Care – PPO | Admitting: Physical Therapy

## 2023-04-09 ENCOUNTER — Encounter: Payer: BC Managed Care – PPO | Admitting: Physical Therapy

## 2023-04-13 ENCOUNTER — Encounter: Payer: BC Managed Care – PPO | Admitting: Physical Therapy

## 2023-04-16 ENCOUNTER — Encounter: Payer: BC Managed Care – PPO | Admitting: Physical Therapy

## 2023-04-20 ENCOUNTER — Encounter: Payer: BC Managed Care – PPO | Admitting: Physical Therapy

## 2023-04-22 ENCOUNTER — Encounter: Payer: BC Managed Care – PPO | Admitting: Physical Therapy

## 2023-04-27 ENCOUNTER — Encounter: Payer: BC Managed Care – PPO | Admitting: Physical Therapy

## 2023-04-29 ENCOUNTER — Encounter: Payer: BC Managed Care – PPO | Admitting: Physical Therapy

## 2023-05-05 ENCOUNTER — Encounter: Payer: BC Managed Care – PPO | Admitting: Physical Therapy

## 2023-05-07 ENCOUNTER — Encounter: Payer: BC Managed Care – PPO | Admitting: Physical Therapy

## 2023-12-10 ENCOUNTER — Other Ambulatory Visit: Payer: Self-pay | Admitting: Family Medicine

## 2023-12-10 DIAGNOSIS — Z1231 Encounter for screening mammogram for malignant neoplasm of breast: Secondary | ICD-10-CM

## 2024-01-26 ENCOUNTER — Telehealth: Payer: Self-pay | Admitting: *Deleted

## 2024-01-26 ENCOUNTER — Ambulatory Visit
Admission: RE | Admit: 2024-01-26 | Discharge: 2024-01-26 | Disposition: A | Discharge status: Home / Self Care | Payer: 59 | Source: Ambulatory Visit | Attending: Family Medicine | Admitting: Family Medicine

## 2024-01-26 DIAGNOSIS — Z1231 Encounter for screening mammogram for malignant neoplasm of breast: Secondary | ICD-10-CM

## 2024-01-26 DIAGNOSIS — E7800 Pure hypercholesterolemia, unspecified: Secondary | ICD-10-CM

## 2024-01-26 DIAGNOSIS — E039 Hypothyroidism, unspecified: Secondary | ICD-10-CM

## 2024-01-26 NOTE — Telephone Encounter (Signed)
-----   Message from Lovena Neighbours sent at 01/26/2024  8:49 AM EST ----- Regarding: Labs for Tuesday 2.25.25 Please put lab orders in future. Thank you, Denny Peon

## 2024-02-02 ENCOUNTER — Encounter: Payer: Self-pay | Admitting: Family Medicine

## 2024-02-02 ENCOUNTER — Other Ambulatory Visit (INDEPENDENT_AMBULATORY_CARE_PROVIDER_SITE_OTHER): Payer: 59

## 2024-02-02 DIAGNOSIS — E7800 Pure hypercholesterolemia, unspecified: Secondary | ICD-10-CM | POA: Diagnosis not present

## 2024-02-02 DIAGNOSIS — E039 Hypothyroidism, unspecified: Secondary | ICD-10-CM

## 2024-02-02 LAB — LIPID PANEL
Cholesterol: 188 mg/dL (ref 0–200)
HDL: 60.9 mg/dL (ref >39.00)
LDL Cholesterol: 112 mg/dL — ABNORMAL HIGH (ref 0–99)
NonHDL: 127.36
Total CHOL/HDL Ratio: 3
Triglycerides: 79 mg/dL (ref 0.0–149.0)
VLDL: 15.8 mg/dL (ref 0.0–40.0)

## 2024-02-02 LAB — COMPREHENSIVE METABOLIC PANEL
ALT: 12 U/L (ref 0–35)
AST: 14 U/L (ref 0–37)
Albumin: 4.5 g/dL (ref 3.5–5.2)
Alkaline Phosphatase: 47 U/L (ref 39–117)
BUN: 19 mg/dL (ref 6–23)
CO2: 28 meq/L (ref 19–32)
Calcium: 9.7 mg/dL (ref 8.4–10.5)
Chloride: 103 meq/L (ref 96–112)
Creatinine, Ser: 0.96 mg/dL (ref 0.40–1.20)
GFR: 62.64 mL/min (ref >60.00)
Glucose, Bld: 98 mg/dL (ref 70–99)
Potassium: 4.1 meq/L (ref 3.5–5.1)
Sodium: 141 meq/L (ref 135–145)
Total Bilirubin: 0.7 mg/dL (ref 0.2–1.2)
Total Protein: 6.9 g/dL (ref 6.0–8.3)

## 2024-02-02 LAB — TSH: TSH: 3.34 u[IU]/mL (ref 0.35–5.50)

## 2024-02-02 LAB — T3, FREE: T3, Free: 2.9 pg/mL (ref 2.3–4.2)

## 2024-02-02 LAB — T4, FREE: Free T4: 0.94 ng/dL (ref 0.60–1.60)

## 2024-02-02 NOTE — Progress Notes (Signed)
 No critical labs need to be addressed urgently. We will discuss labs in detail at upcoming office visit.

## 2024-02-04 ENCOUNTER — Other Ambulatory Visit: Payer: BC Managed Care – PPO

## 2024-02-11 ENCOUNTER — Encounter: Payer: Self-pay | Admitting: Family Medicine

## 2024-02-11 ENCOUNTER — Ambulatory Visit (INDEPENDENT_AMBULATORY_CARE_PROVIDER_SITE_OTHER): Payer: BC Managed Care – PPO | Admitting: Family Medicine

## 2024-02-11 VITALS — BP 126/82 | HR 75 | Temp 97.5°F | Ht 62.5 in | Wt 185.0 lb

## 2024-02-11 DIAGNOSIS — E039 Hypothyroidism, unspecified: Secondary | ICD-10-CM

## 2024-02-11 DIAGNOSIS — Z23 Encounter for immunization: Secondary | ICD-10-CM | POA: Diagnosis not present

## 2024-02-11 DIAGNOSIS — I10 Essential (primary) hypertension: Secondary | ICD-10-CM | POA: Diagnosis not present

## 2024-02-11 DIAGNOSIS — Z0000 Encounter for general adult medical examination without abnormal findings: Secondary | ICD-10-CM

## 2024-02-11 DIAGNOSIS — E7800 Pure hypercholesterolemia, unspecified: Secondary | ICD-10-CM

## 2024-02-11 NOTE — Patient Instructions (Addendum)
 Try to stop benadryl... increase melatonin to 10 mg .  Call or follow up if insomnia not improving.  Work on Tree surgeon.  Call if not improving for trial of urge incontinence medication ( oxybutynin) or trazodone.

## 2024-02-11 NOTE — Addendum Note (Signed)
 Addended by: Lonia Blood on: 02/11/2024 09:54 AM   Modules accepted: Orders

## 2024-02-11 NOTE — Assessment & Plan Note (Signed)
Stable, chronic.  Continue current medication.   Levo 50 mcg daily 

## 2024-02-11 NOTE — Assessment & Plan Note (Signed)
Stable, chronic.  Continue current medication.    pravastatin 20 mg daily 

## 2024-02-11 NOTE — Assessment & Plan Note (Signed)
Stable, chronic.  Continue current medication.  At goal on losartan HCTZ 50/12.5 mg daily 

## 2024-02-11 NOTE — Progress Notes (Signed)
 Patient ID: Summer Hernandez, female    DOB: 06-03-1961, 63 y.o.   MRN: [id number]  This visit was conducted in person.  BP 126/82   Pulse 75   Temp (!) 97.5 F (36.4 C) (Temporal)   Ht 5' 2.5" (1.588 m)   Wt 185 lb (83.9 kg)   SpO2 99%   BMI 33.30 kg/m    CC:  Chief Complaint  Patient presents with   Annual Exam    Subjective:   HPI: Summer Hernandez is a 63 y.o. female presenting on 02/11/2024 for Annual Exam The patient presents for  complete physical and review of chronic health problems. He/She also has the following acute concerns today: none   Chronic right OA: recent gel injections in right knee... helped some.  Recent bursitis right hip... some improvement with prednisone.   Hypertension:     At goal on losartan HCTZ 50/12.5 mg daily BP Readings from Last 3 Encounters:  02/11/24 126/82  02/10/23 110/80  12/31/22 (!) 144/92  Using medication without problems or lightheadedness:  none Chest pain with exertion: none Edema:none Short of breath:none Average home BPs: Other issues:  Elevated Cholesterol:  slightly worsened control on pravastatin 20 mg daily Lab Results  Component Value Date   CHOL 188 02/02/2024   HDL 60.90 02/02/2024   LDLCALC 112 (H) 02/02/2024   LDLDIRECT 130.9 11/21/2013   TRIG 79.0 02/02/2024   CHOLHDL 3 02/02/2024  Using medications without problems: none Muscle aches:  none Diet compliance: moderate..  Fully if March is better in April is even better and maybe get people to keep working on changes so we do not have to do that but and I will Exercise:  playing pickle ball and strength training Other complaints: Wt Readings from Last 3 Encounters:  02/11/24 185 lb (83.9 kg)  02/10/23 183 lb 6 oz (83.2 kg)  08/21/22 186 lb 2 oz (84.4 kg)   The 10-year ASCVD risk score (Arnett DK, et al., 2019) is: 6%   Values used to calculate the score:     Age: 42 years     Sex: Female     Is Non-Hispanic African American: No     Diabetic: No      Tobacco smoker: No     Systolic Blood Pressure: 126 mmHg     Is BP treated: Yes     HDL Cholesterol: 60.9 mg/dL     Total Cholesterol: 188 mg/dL   Hypothyroid  good energy overall. Levo 50 mcg daily Lab Results  Component Value Date   TSH 3.34 02/02/2024   Has noted some decrease in memory at time... discussed benadryl possible SE from long term use... she will try to change.  Falls asleep okay but wakes 3 times for urination.   Relevant past medical, surgical, family and social history reviewed and updated as indicated. Interim medical history since our last visit reviewed. Allergies and medications reviewed and updated. Outpatient Medications Prior to Visit  Medication Sig Dispense Refill   aspirin 81 MG EC tablet      celecoxib (CELEBREX) 50 MG capsule Take 50 mg by mouth as needed for pain.     diphenhydrAMINE (SOMINEX) 25 MG tablet Take 25 mg by mouth at bedtime as needed.     levothyroxine (SYNTHROID) 50 MCG tablet TAKE 1 TABLET BY MOUTH EVERY DAY 90 tablet 3   losartan-hydrochlorothiazide (HYZAAR) 50-12.5 MG tablet TAKE 1 TABLET BY MOUTH EVERY DAY 90 tablet 3   Misc Natural  Products (GLUCOSAMINE CHOND CMP ADVANCED) TABS      omeprazole (PRILOSEC) 20 MG capsule Take 20 mg by mouth every other day.      pravastatin (PRAVACHOL) 20 MG tablet TAKE 1 TABLET BY MOUTH EVERY DAY 90 tablet 3   SOOLANTRA 1 % CREA Apply topically.     Calcium Carbonate-Vitamin D (CALCIUM-VITAMIN D) 600-200 MG-UNIT CAPS Take 1 capsule by mouth 2 (two) times daily. (Patient not taking: Reported on 02/11/2024)     No facility-administered medications prior to visit.     Per HPI unless specifically indicated in ROS section below Review of Systems  Constitutional:  Negative for fatigue and fever.  HENT:  Negative for congestion.   Eyes:  Negative for pain.  Respiratory:  Negative for cough and shortness of breath.   Cardiovascular:  Negative for chest pain, palpitations and leg swelling.   Gastrointestinal:  Negative for abdominal pain.  Genitourinary:  Negative for dysuria and vaginal bleeding.  Musculoskeletal:  Negative for back pain.  Neurological:  Negative for syncope, light-headedness and headaches.  Psychiatric/Behavioral:  Negative for dysphoric mood.    Objective:  BP 126/82   Pulse 75   Temp (!) 97.5 F (36.4 C) (Temporal)   Ht 5' 2.5" (1.588 m)   Wt 185 lb (83.9 kg)   SpO2 99%   BMI 33.30 kg/m   Wt Readings from Last 3 Encounters:  02/11/24 185 lb (83.9 kg)  02/10/23 183 lb 6 oz (83.2 kg)  08/21/22 186 lb 2 oz (84.4 kg)      Physical Exam Vitals and nursing note reviewed.  Constitutional:      General: She is not in acute distress.    Appearance: Normal appearance. She is well-developed. She is not ill-appearing or toxic-appearing.  HENT:     Head: Normocephalic.     Right Ear: Hearing, tympanic membrane, ear canal and external ear normal.     Left Ear: Hearing, tympanic membrane, ear canal and external ear normal.     Nose: Nose normal.  Eyes:     General: Lids are normal. Lids are everted, no foreign bodies appreciated.     Conjunctiva/sclera: Conjunctivae normal.     Pupils: Pupils are equal, round, and reactive to light.  Neck:     Thyroid: No thyroid mass or thyromegaly.     Vascular: No carotid bruit.     Trachea: Trachea normal.  Cardiovascular:     Rate and Rhythm: Normal rate and regular rhythm.     Heart sounds: Normal heart sounds, S1 normal and S2 normal. No murmur heard.    No gallop.  Pulmonary:     Effort: Pulmonary effort is normal. No respiratory distress.     Breath sounds: Normal breath sounds. No wheezing, rhonchi or rales.  Abdominal:     General: Bowel sounds are normal. There is no distension or abdominal bruit.     Palpations: Abdomen is soft. There is no fluid wave or mass.     Tenderness: There is no abdominal tenderness. There is no guarding or rebound.     Hernia: No hernia is present.  Genitourinary:     Exam position: Supine.     Labia:        Right: No rash, tenderness or lesion.        Left: No rash, tenderness or lesion.      Vagina: Normal.     Cervix: No cervical motion tenderness, discharge or friability.     Uterus: Not enlarged and  not tender.      Adnexa:        Right: No mass, tenderness or fullness.         Left: No mass, tenderness or fullness.    Musculoskeletal:     Cervical back: Normal range of motion and neck supple.  Lymphadenopathy:     Cervical: No cervical adenopathy.  Skin:    General: Skin is warm and dry.     Findings: No rash.  Neurological:     Mental Status: She is alert.     Cranial Nerves: No cranial nerve deficit.     Sensory: No sensory deficit.  Psychiatric:        Mood and Affect: Mood is not anxious or depressed.        Speech: Speech normal.        Behavior: Behavior normal. Behavior is cooperative.        Judgment: Judgment normal.       Results for orders placed or performed in visit on 02/02/24  T3, free   Collection Time: 02/02/24  8:07 AM  Result Value Ref Range   T3, Free 2.9 2.3 - 4.2 pg/mL  T4, free   Collection Time: 02/02/24  8:07 AM  Result Value Ref Range   Free T4 0.94 0.60 - 1.60 ng/dL  TSH   Collection Time: 02/02/24  8:07 AM  Result Value Ref Range   TSH 3.34 0.35 - 5.50 uIU/mL  Lipid panel   Collection Time: 02/02/24  8:07 AM  Result Value Ref Range   Cholesterol 188 0 - 200 mg/dL   Triglycerides 40.3 0.0 - 149.0 mg/dL   HDL 47.42 >59.56 mg/dL   VLDL 38.7 0.0 - 56.4 mg/dL   LDL Cholesterol 332 (H) 0 - 99 mg/dL   Total CHOL/HDL Ratio 3    NonHDL 127.36   Comprehensive metabolic panel   Collection Time: 02/02/24  8:07 AM  Result Value Ref Range   Sodium 141 135 - 145 mEq/L   Potassium 4.1 3.5 - 5.1 mEq/L   Chloride 103 96 - 112 mEq/L   CO2 28 19 - 32 mEq/L   Glucose, Bld 98 70 - 99 mg/dL   BUN 19 6 - 23 mg/dL   Creatinine, Ser 9.51 0.40 - 1.20 mg/dL   Total Bilirubin 0.7 0.2 - 1.2 mg/dL   Alkaline  Phosphatase 47 39 - 117 U/L   AST 14 0 - 37 U/L   ALT 12 0 - 35 U/L   Total Protein 6.9 6.0 - 8.3 g/dL   Albumin 4.5 3.5 - 5.2 g/dL   GFR 88.41 >66.06 mL/min   Calcium 9.7 8.4 - 10.5 mg/dL    This visit occurred during the SARS-CoV-2 public health emergency.  Safety protocols were in place, including screening questions prior to the visit, additional usage of staff PPE, and extensive cleaning of exam room while observing appropriate contact time as indicated for disinfecting solutions.   COVID 19 screen:  No recent travel or known exposure to COVID19 The patient denies respiratory symptoms of COVID 19 at this time. The importance of social distancing was discussed today.   Assessment and Plan The patient's preventative maintenance and recommended screening tests for an annual wellness exam were reviewed in full today. Brought up to date unless services declined.  Counselled on the importance of diet, exercise, and its role in overall health and mortality. The patient's FH and SH was reviewed, including their home life, tobacco status, and drug and alcohol  status.     Vaccines: Td given,  COVID x 3, refused flu, Consider Shingrix. Colon: 12/2020 per pt  [facility], repeat in 5 years. Mammo: nml 01/2024 PAP/DVE: Nml pap 2023, no HPV, q 5 year,  No family history of ovarian cancer  Nonsmoker.  ETOH: occ HIV: refused Hep C: neg DeXA: 01/2023 normal.. repeat in 5 years.   Problem List Items Addressed This Visit     Essential hypertension, benign (Chronic)   Stable, chronic.  Continue current medication.  At goal on losartan HCTZ 50/12.5 mg daily      HYPERCHOLESTEROLEMIA (Chronic)   Stable, chronic.  Continue current medication.    pravastatin 20 mg daily      Hypothyroidism (Chronic)   Stable, chronic.  Continue current medication.   Levo 50 mcg daily      Other Visit Diagnoses       Routine general medical examination at a health care facility    -  Primary      Return in about 1 year (around 02/10/2025) for welome to medicare wellness with labs prior.   Kerby Nora, MD

## 2024-02-15 ENCOUNTER — Ambulatory Visit: Admission: EM | Admit: 2024-02-15 | Discharge: 2024-02-15 | Disposition: A | Discharge status: Home / Self Care

## 2024-02-15 ENCOUNTER — Other Ambulatory Visit: Payer: Self-pay

## 2024-02-15 ENCOUNTER — Encounter: Payer: Self-pay | Admitting: Emergency Medicine

## 2024-02-15 DIAGNOSIS — R0981 Nasal congestion: Secondary | ICD-10-CM | POA: Diagnosis not present

## 2024-02-15 NOTE — Discharge Instructions (Signed)
 Today are most likely related to seasonal weather change and stopping of your Benadryl antihistamine  Begin daily use of Zyrtec, Allegra, Claritin or similar product to help reduce sinus congestion   For cough: honey 1/2 to 1 teaspoon (you can dilute the honey in water or another fluid).  You can also use guaifenesin and dextromethorphan for cough. You can use a humidifier for chest congestion and cough.  If you don't have a humidifier, you can sit in the bathroom with the hot shower running.      For sore throat: try warm salt water gargles, cepacol lozenges, throat spray, warm tea or water with lemon/honey, popsicles or ice, or OTC cold relief medicine for throat discomfort.   For congestion: take a daily anti-histamine like Zyrtec, Claritin, and a oral decongestant, such as pseudoephedrine.  You can also use Flonase 1-2 sprays in each nostril daily.   It is important to stay hydrated: drink plenty of fluids (water, gatorade/powerade/pedialyte, juices, or teas) to keep your throat moisturized and help further relieve irritation/discomfort.

## 2024-02-15 NOTE — ED Provider Notes (Signed)
 Renaldo Fiddler    CSN: [id number] Arrival date & time: 02/15/24  1146      History   Chief Complaint Chief Complaint  Patient presents with   Nasal Congestion    HPI Summer Hernandez is a 63 y.o. female.   Patient presents for evaluation of nasal congestion, postnasal drip and a mild nonproductive cough present for 3 days.  No known sick contacts.  Tolerating food and liquids.  Has attempted use of Mucinex which has been helpful.  Endorses 3 days ago stop daily antihistamine that she was using for sleep.   Past Medical History:  Diagnosis Date   GERD (gastroesophageal reflux disease)    Hyperlipidemia    Hypertension    Shortness of breath dyspnea    Thyroid disease     Patient Active Problem List   Diagnosis Date Noted   Bee sting reaction 07/03/2020   Arthritis of carpometacarpal (CMC) joint of right thumb 01/24/2020   Greater trochanteric bursitis of left hip 01/24/2020   Acute pain of left shoulder 01/20/2019   Acute pain of right knee 01/20/2019   Hypothyroidism 01/15/2018   ACNE ROSACEA 11/19/2010   HYPERCHOLESTEROLEMIA 05/16/2010   Essential hypertension, benign 02/04/2010    Past Surgical History:  Procedure Laterality Date   CESAREAN SECTION     CHOLECYSTECTOMY  12/29/06   CHOLECYSTECTOMY, LAPAROSCOPIC     COLONOSCOPY N/A 04/27/2015   Procedure: COLONOSCOPY;  Surgeon: Scot Jun, MD;  Location: [facility] ENDOSCOPY;  Service: Endoscopy;  Laterality: N/A;   PLANTAR FASCIA SURGERY Right 12/08/2010   plantar fascitis     TUBAL LIGATION  01/18/86    OB History   No obstetric history on file.      Home Medications    Prior to Admission medications   Medication Sig Start Date End Date Taking? Authorizing Provider  aspirin 81 MG EC tablet     [provider]  Calcium Carbonate-Vitamin D (CALCIUM-VITAMIN D) 600-200 MG-UNIT CAPS Take 1 capsule by mouth 2 (two) times daily. Patient not taking: Reported on 02/11/2024    [provider]  celecoxib (CELEBREX) 50 MG capsule Take 50 mg by mouth as needed for pain.    [provider]  diphenhydrAMINE (SOMINEX) 25 MG tablet Take 25 mg by mouth at bedtime as needed.    [provider]  levothyroxine (SYNTHROID) 50 MCG tablet TAKE 1 TABLET BY MOUTH EVERY DAY 02/24/23   Bedsole, Amy E, MD  losartan-hydrochlorothiazide (HYZAAR) 50-12.5 MG tablet TAKE 1 TABLET BY MOUTH EVERY DAY 02/24/23   Excell Seltzer, MD  Misc Natural Products (GLUCOSAMINE CHOND CMP ADVANCED) TABS  06/13/19   [provider]  omeprazole (PRILOSEC) 20 MG capsule Take 20 mg by mouth every other day.     [provider]  pravastatin (PRAVACHOL) 20 MG tablet TAKE 1 TABLET BY MOUTH EVERY DAY 02/24/23   Bedsole, Amy E, MD  SOOLANTRA 1 % CREA Apply topically. 01/17/21   [provider]    Family History Family History  Problem Relation Age of Onset   COPD Mother    Cancer Father        rectal and lung   Hypertension Brother    Heart attack Maternal Grandfather    Diabetes Paternal Grandmother    Breast cancer Maternal Aunt    Breast cancer Paternal Aunt    Breast cancer Maternal Aunt     Social History Social History   Tobacco Use   Smoking status: Never  Smokeless tobacco: Never  Substance Use Topics   Alcohol use: Yes    Alcohol/week: 5.0 standard drinks of alcohol    Types: 5 Glasses of wine per week    Comment: occasional   Drug use: No     Allergies   Bee venom   Review of Systems Review of Systems   Physical Exam Triage Vital Signs ED Triage Vitals  Encounter Vitals Group     BP 02/15/24 1203 132/86     Systolic BP Percentile --      Diastolic BP Percentile --      Pulse Rate 02/15/24 1203 75     Resp 02/15/24 1203 18     Temp 02/15/24 1203 (!) 97.1 F (36.2 C)     Temp Source 02/15/24 1203 Temporal     SpO2 02/15/24 1203 95 %     Weight --      Height --      Head Circumference --      Peak Flow --      Pain Score 02/15/24 1204 0      Pain Loc --      Pain Education --      Exclude from Growth Chart --    No data found.  Updated Vital Signs BP 132/86 (BP Location: Left Arm)   Pulse 75   Temp (!) 97.1 F (36.2 C) (Temporal)   Resp 18   SpO2 95%   Visual Acuity Right Eye Distance:   Left Eye Distance:   Bilateral Distance:    Right Eye Near:   Left Eye Near:    Bilateral Near:     Physical Exam Constitutional:      Appearance: Normal appearance.  HENT:     Right Ear: Tympanic membrane, ear canal and external ear normal.     Left Ear: Tympanic membrane, ear canal and external ear normal.     Nose: Congestion present.     Mouth/Throat:     Pharynx: No oropharyngeal exudate or posterior oropharyngeal erythema.  Eyes:     Extraocular Movements: Extraocular movements intact.  Cardiovascular:     Rate and Rhythm: Normal rate and regular rhythm.     Pulses: Normal pulses.     Heart sounds: Normal heart sounds.  Pulmonary:     Effort: Pulmonary effort is normal.     Breath sounds: Normal breath sounds.  Musculoskeletal:     Cervical back: Normal range of motion and neck supple.  Neurological:     Mental Status: She is alert and oriented to person, place, and time. Mental status is at baseline.      UC Treatments / Results  Labs (all labs ordered are listed, but only abnormal results are displayed) Labs Reviewed - No data to display  EKG   Radiology No results found.  Procedures Procedures (including critical care time)  Medications Ordered in UC Medications - No data to display  Initial Impression / Assessment and Plan / UC Course  I have reviewed the triage vital signs and the nursing notes.  Pertinent labs & imaging results that were available during my care of the patient were reviewed by me and considered in my medical decision making (see chart for details).  Nasal congestion  Etiology most likely seasonal allergies and weather change, at this time denies fever low suspicion  for infectious cause but possible alternative, discussed this with patient, recommended supportive care through Mucinex, advised beginning nondrowsy antihistamine such as Claritin or Zyrtec or similar  product, recommended additional supportive care with follow-up as needed Final Clinical Impressions(s) / UC Diagnoses   Final diagnoses:  Nasal congestion     Discharge Instructions      Today are most likely related to seasonal weather change and stopping of your Benadryl antihistamine  Begin daily use of Zyrtec, Allegra, Claritin or similar product to help reduce sinus congestion   For cough: honey 1/2 to 1 teaspoon (you can dilute the honey in water or another fluid).  You can also use guaifenesin and dextromethorphan for cough. You can use a humidifier for chest congestion and cough.  If you don't have a humidifier, you can sit in the bathroom with the hot shower running.      For sore throat: try warm salt water gargles, cepacol lozenges, throat spray, warm tea or water with lemon/honey, popsicles or ice, or OTC cold relief medicine for throat discomfort.   For congestion: take a daily anti-histamine like Zyrtec, Claritin, and a oral decongestant, such as pseudoephedrine.  You can also use Flonase 1-2 sprays in each nostril daily.   It is important to stay hydrated: drink plenty of fluids (water, gatorade/powerade/pedialyte, juices, or teas) to keep your throat moisturized and help further relieve irritation/discomfort.    ED Prescriptions   None    PDMP not reviewed this encounter.   Valinda Hoar, NP 02/15/24 1226

## 2024-02-15 NOTE — ED Triage Notes (Signed)
 Patient presents to [facility] for evaluation of mild cough and sniffles since Friday.  Patient stopped taking her nightly Benadryl that she uses to help her sleep.  Denies fevers, or any other symptoms.

## 2024-02-22 ENCOUNTER — Encounter: Payer: Self-pay | Admitting: Family Medicine

## 2024-02-23 ENCOUNTER — Other Ambulatory Visit: Payer: Self-pay | Admitting: Family Medicine

## 2024-02-23 MED ORDER — AMOXICILLIN 500 MG PO CAPS: 1000.0000 mg | ORAL_CAPSULE | Freq: Two times a day (BID) | ORAL | 0 refills | Status: DC

## 2024-02-28 ENCOUNTER — Other Ambulatory Visit: Payer: Self-pay | Admitting: Family Medicine

## 2024-03-10 ENCOUNTER — Ambulatory Visit (INDEPENDENT_AMBULATORY_CARE_PROVIDER_SITE_OTHER)
Admission: RE | Admit: 2024-03-10 | Discharge: 2024-03-10 | Disposition: A | Discharge status: Home / Self Care | Source: Ambulatory Visit | Attending: Family Medicine | Admitting: Family Medicine

## 2024-03-10 ENCOUNTER — Encounter: Payer: Self-pay | Admitting: Family Medicine

## 2024-03-10 ENCOUNTER — Ambulatory Visit: Admitting: Family Medicine

## 2024-03-10 VITALS — BP 120/82 | HR 77 | Temp 98.3°F | Ht 62.5 in | Wt 186.4 lb

## 2024-03-10 DIAGNOSIS — M25551 Pain in right hip: Secondary | ICD-10-CM

## 2024-03-10 DIAGNOSIS — M25552 Pain in left hip: Secondary | ICD-10-CM

## 2024-03-10 NOTE — Assessment & Plan Note (Signed)
 Acute Initially when in right hip orthopedic MD felt it was due to bursitis.  She had minimal improvement with prednisone taper.  She only used Celebrex occasionally. She never started physical therapy exercises at home.  She now has bilateral pain some in sciatic notch without tenderness over trochanteric bursa with some radiation down anterior thighs.  I have some concern for possible lumbar spine origin of the pain, possibly spinal stenosis. Will evaluate with lumbar spine x-ray. Will refer to physical therapy.  She will start Celebrex a daily for the next 2 weeks. She will follow-up with orthopedics if her symptoms or not improving as expected.

## 2024-03-10 NOTE — Progress Notes (Signed)
 Patient ID: Summer Hernandez, female    DOB: [birth year], 63 y.o.   MRN: [id number]  This visit was conducted in person.  BP 120/82   Pulse 77   Temp 98.3 F (36.8 C) (Oral)   Ht 5' 2.5" (1.588 m)   Wt 186 lb 6 oz (84.5 kg)   SpO2 97%   BMI 33.55 kg/m    CC:  Chief Complaint  Patient presents with   Hip Pain    C/o B hip pain radiating into groin. R side pain since fall 2024- followed by ortho. L hip pain started about 2 wks ago.     Subjective:   HPI: Summer Hernandez is a 63 y.o. female presenting on 03/10/2024 for Hip Pain (C/o B hip pain radiating into groin. R side pain since fall 2024- followed by ortho. L hip pain started about 2 wks ago. )  New onset pain in both hips radiating into the groin. Right side started hurting  in 2024.  She was seen by orthopedic at the time... Dx with bursitits. Xray of right hip:  Negative... given steroid pack... minimal improvement.   Was using Celebrex off and on. Has not been doing exorcises regularly.   Was walking differently given right knee issues. Her left hip pain started about 2 weeks ago.   No injury proceeding.   Pain in right is in right buttock and radiates to  right groin.  ON left pain is more laterally in hp and radiates as well.  Minimal pain unitl going sitting to standing. Hips feel stiff.   She has been playing pickle  ball.... Stared last fall.   Using ibuprofen 400 mg  at night , and  tylenol arthritis in AM... helps some.   No numbness or weakness in legs.  No fever,  No incontinence.      Relevant past medical, surgical, family and social history reviewed and updated as indicated. Interim medical history since our last visit reviewed. Allergies and medications reviewed and updated. Outpatient Medications Prior to Visit  Medication Sig Dispense Refill   aspirin 81 MG EC tablet      Calcium Carbonate-Vitamin D (CALCIUM-VITAMIN D) 600-200 MG-UNIT CAPS Take 1 capsule by mouth 2 (two) times daily.      celecoxib (CELEBREX) 50 MG capsule Take 50 mg by mouth as needed for pain.     levothyroxine (SYNTHROID) 50 MCG tablet TAKE 1 TABLET BY MOUTH EVERY DAY 90 tablet 3   losartan-hydrochlorothiazide (HYZAAR) 50-12.5 MG tablet TAKE 1 TABLET BY MOUTH EVERY DAY 90 tablet 3   Misc Natural Products (GLUCOSAMINE CHOND CMP ADVANCED) TABS      omeprazole (PRILOSEC) 20 MG capsule Take 20 mg by mouth every other day.      pravastatin (PRAVACHOL) 20 MG tablet TAKE 1 TABLET BY MOUTH EVERY DAY 90 tablet 3   SOOLANTRA 1 % CREA Apply topically.     amoxicillin (AMOXIL) 500 MG capsule Take 2 capsules (1,000 mg total) by mouth 2 (two) times daily. 40 capsule 0   diphenhydrAMINE (SOMINEX) 25 MG tablet Take 25 mg by mouth at bedtime as needed.     No facility-administered medications prior to visit.     Per HPI unless specifically indicated in ROS section below Review of Systems  Constitutional:  Negative for fatigue and fever.  HENT:  Negative for congestion.   Eyes:  Negative for pain.  Respiratory:  Negative for cough and shortness of breath.   Cardiovascular:  Negative  for chest pain, palpitations and leg swelling.  Gastrointestinal:  Negative for abdominal pain.  Genitourinary:  Negative for dysuria and vaginal bleeding.  Musculoskeletal:  Positive for arthralgias and back pain.  Neurological:  Negative for syncope, light-headedness and headaches.  Psychiatric/Behavioral:  Negative for dysphoric mood.    Objective:  BP 120/82   Pulse 77   Temp 98.3 F (36.8 C) (Oral)   Ht 5' 2.5" (1.588 m)   Wt 186 lb 6 oz (84.5 kg)   SpO2 97%   BMI 33.55 kg/m   Wt Readings from Last 3 Encounters:  03/10/24 186 lb 6 oz (84.5 kg)  02/11/24 185 lb (83.9 kg)  02/10/23 183 lb 6 oz (83.2 kg)      Physical Exam Constitutional:      General: She is not in acute distress.    Appearance: Normal appearance. She is well-developed. She is not ill-appearing or toxic-appearing.  HENT:     Head: Normocephalic.      Right Ear: Hearing, tympanic membrane, ear canal and external ear normal. Tympanic membrane is not erythematous, retracted or bulging.     Left Ear: Hearing, tympanic membrane, ear canal and external ear normal. Tympanic membrane is not erythematous, retracted or bulging.     Nose: No mucosal edema or rhinorrhea.     Right Sinus: No maxillary sinus tenderness or frontal sinus tenderness.     Left Sinus: No maxillary sinus tenderness or frontal sinus tenderness.     Mouth/Throat:     Pharynx: Uvula midline.  Eyes:     General: Lids are normal. Lids are everted, no foreign bodies appreciated.     Conjunctiva/sclera: Conjunctivae normal.     Pupils: Pupils are equal, round, and reactive to light.  Neck:     Thyroid: No thyroid mass or thyromegaly.     Vascular: No carotid bruit.     Trachea: Trachea normal.  Cardiovascular:     Rate and Rhythm: Normal rate and regular rhythm.     Pulses: Normal pulses.     Heart sounds: Normal heart sounds, S1 normal and S2 normal. No murmur heard.    No friction rub. No gallop.  Pulmonary:     Effort: Pulmonary effort is normal. No tachypnea or respiratory distress.     Breath sounds: Normal breath sounds. No decreased breath sounds, wheezing, rhonchi or rales.  Abdominal:     General: Bowel sounds are normal.     Palpations: Abdomen is soft.     Tenderness: There is no abdominal tenderness.  Musculoskeletal:     Cervical back: Normal, normal range of motion and neck supple.     Thoracic back: Normal.     Lumbar back: Tenderness present. No bony tenderness. Normal range of motion. Negative right straight leg raise test and negative left straight leg raise test.     Right hip: Tenderness present. No bony tenderness. Decreased range of motion. Normal strength.     Left hip: Tenderness present. No bony tenderness. Decreased range of motion. Normal strength.     Comments: Some low back pain with extension Decreased range of motion in hips given tightness  but eventually able to relax and externally rotate hips No tenderness to palpation over trochanteric bursa's bilaterally, some pain anteriorly in the left groin, tenderness to palpation in right sciatic notch.  Skin:    General: Skin is warm and dry.     Findings: No rash.  Neurological:     Mental Status: She is alert.  Psychiatric:        Mood and Affect: Mood is not anxious or depressed.        Speech: Speech normal.        Behavior: Behavior normal. Behavior is cooperative.        Thought Content: Thought content normal.        Judgment: Judgment normal.       Results for orders placed or performed in visit on 02/02/24  T3, free   Collection Time: 02/02/24  8:07 AM  Result Value Ref Range   T3, Free 2.9 2.3 - 4.2 pg/mL  T4, free   Collection Time: 02/02/24  8:07 AM  Result Value Ref Range   Free T4 0.94 0.60 - 1.60 ng/dL  TSH   Collection Time: 02/02/24  8:07 AM  Result Value Ref Range   TSH 3.34 0.35 - 5.50 uIU/mL  Lipid panel   Collection Time: 02/02/24  8:07 AM  Result Value Ref Range   Cholesterol 188 0 - 200 mg/dL   Triglycerides 09.8 0.0 - 149.0 mg/dL   HDL 11.91 >47.82 mg/dL   VLDL 95.6 0.0 - 21.3 mg/dL   LDL Cholesterol 086 (H) 0 - 99 mg/dL   Total CHOL/HDL Ratio 3    NonHDL 127.36   Comprehensive metabolic panel   Collection Time: 02/02/24  8:07 AM  Result Value Ref Range   Sodium 141 135 - 145 mEq/L   Potassium 4.1 3.5 - 5.1 mEq/L   Chloride 103 96 - 112 mEq/L   CO2 28 19 - 32 mEq/L   Glucose, Bld 98 70 - 99 mg/dL   BUN 19 6 - 23 mg/dL   Creatinine, Ser 5.78 0.40 - 1.20 mg/dL   Total Bilirubin 0.7 0.2 - 1.2 mg/dL   Alkaline Phosphatase 47 39 - 117 U/L   AST 14 0 - 37 U/L   ALT 12 0 - 35 U/L   Total Protein 6.9 6.0 - 8.3 g/dL   Albumin 4.5 3.5 - 5.2 g/dL   GFR 46.96 >29.52 mL/min   Calcium 9.7 8.4 - 10.5 mg/dL    Assessment and Plan  Bilateral hip pain Assessment & Plan: Acute Initially when in right hip orthopedic MD felt it was due to  bursitis.  She had minimal improvement with prednisone taper.  She only used Celebrex occasionally. She never started physical therapy exercises at home.  She now has bilateral pain some in sciatic notch without tenderness over trochanteric bursa with some radiation down anterior thighs.  I have some concern for possible lumbar spine origin of the pain, possibly spinal stenosis. Will evaluate with lumbar spine x-ray. Will refer to physical therapy.  She will start Celebrex a daily for the next 2 weeks. She will follow-up with orthopedics if her symptoms or not improving as expected.  Orders: -     Ambulatory referral to Physical Therapy -     DG Lumbar Spine Complete; Future    No follow-ups on file.   Kerby Nora, MD

## 2024-03-22 ENCOUNTER — Encounter: Admitting: Physical Therapy

## 2024-03-22 NOTE — Therapy (Unsigned)
 OUTPATIENT PHYSICAL THERAPY LOWER EXTREMITY EVALUATION   Patient Name: Summer Hernandez MRN: [id number] DOB:[birth year], 63 y.o., female Today's Date: 03/23/2024  END OF SESSION:  PT End of Session - 03/23/24 1427     Visit Number 1    Number of Visits 16    Date for PT Re-Evaluation 05/18/24    Progress Note Due on Visit 10    PT Start Time 1317    PT Stop Time 1359    PT Time Calculation (min) 42 min    Activity Tolerance Patient tolerated treatment well    Behavior During Therapy [facility] for tasks assessed/performed             Past Medical History:  Diagnosis Date   GERD (gastroesophageal reflux disease)    Hyperlipidemia    Hypertension    Shortness of breath dyspnea    Thyroid disease    Past Surgical History:  Procedure Laterality Date   CESAREAN SECTION     CHOLECYSTECTOMY  12/29/06   CHOLECYSTECTOMY, LAPAROSCOPIC     COLONOSCOPY N/A 04/27/2015   Procedure: COLONOSCOPY;  Surgeon: Scot Jun, MD;  Location: [facility] ENDOSCOPY;  Service: Endoscopy;  Laterality: N/A;   PLANTAR FASCIA SURGERY Right 12/08/2010   plantar fascitis     TUBAL LIGATION  01/18/86   Patient Active Problem List   Diagnosis Date Noted   Bee sting reaction 07/03/2020   Arthritis of carpometacarpal [facility]) joint of right thumb 01/24/2020   Bilateral hip pain 01/24/2020   Acute pain of left shoulder 01/20/2019   Acute pain of right knee 01/20/2019   Hypothyroidism 01/15/2018   ACNE ROSACEA 11/19/2010   HYPERCHOLESTEROLEMIA 05/16/2010   Essential hypertension, benign 02/04/2010    PCP:   Excell Seltzer, MD    REFERRING PROVIDER:   Excell Seltzer, MD    REFERRING DIAG: [phone] (ICD-10-CM) - Bilateral hip pain   THERAPY DIAG:  Pain in right hip  Pain in left hip  Stiffness of right hip, not elsewhere classified  Muscle weakness (generalized)  Rationale for Evaluation and Treatment: Rehabilitation  ONSET DATE: 10/2023  SUBJECTIVE:   SUBJECTIVE STATEMENT: Last  fall pt began having pain in her right hip that descended into her right thigh. Pt MD thought it was bursitis and was prescribed prednisone ( not effective) and exercises ( not completed per report). Pt reports rest is helpful but she likes to stay active. Pt likes to play pickle-ball and walk for exercise ( can't do at the moment due to her knee per her MD).   PERTINENT HISTORY: Pmx of GERD, HLD, HTN PAIN:  Are you having pain? Yes: NPRS scale: 1-2/10 Pain location: B hips anterior and R hip posterior Pain description: heaviness, awkwardness  Aggravating factors: increased activity Relieving factors: rest   PRECAUTIONS: None  RED FLAGS: None   WEIGHT BEARING RESTRICTIONS: No  FALLS:  Has patient fallen in last 6 months? No  LIVING ENVIRONMENT: Lives with: lives with their spouse Lives in: House/apartment Stairs: Yes: External: 16 steps; on right going up Has following equipment at home: None  OCCUPATION: Retired Engineer, site, working part time at [employer]   PLOF: Independent  PATIENT GOALS: To improve pain and improve activity tolerance   NEXT MD VISIT: No follow up scheduled   OBJECTIVE:  Note: Objective measures were completed at Evaluation unless otherwise noted.  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    PATIENT SURVEYS:  LEFS 34  COGNITION: Overall cognitive status: Within functional limits for tasks assessed     SENSATION: Not tested  EDEMA:  None present   MUSCLE LENGTH: Hamstrings: Right 35 deg; WNL Thomas test:Post R>L for quad tightness     PALPATION: Assess next visit if indicated   LOWER EXTREMITY EAV:[id number] Hip IR and ER in future session  P ROM Right eval Left eval  Hip flexion WNL WNL  Hip extension    Hip abduction    Hip adduction    Hip internal rotation    Hip external rotation    Knee flexion WNL WNL  Knee extension WNL WNL  Ankle  dorsiflexion    Ankle plantarflexion    Ankle inversion    Ankle eversion     (Blank rows = not tested)  LOWER EXTREMITY MMT: -used active force and took pek muscle force and listed below # = lb  MMT Right eval Left eval  Hip flexion -supine 12.6#* 25#  Hip extension- prone 17.5# 21.3#  Hip abduction - SL 14.7# 22#  Hip adduction    Hip internal rotation    Hip external rotation    Knee flexion    Knee extension 26.2 #* 29#  Ankle dorsiflexion 29.4# 29.3#  Ankle plantarflexion    Ankle inversion    Ankle eversion     (Blank rows = not tested)  LOWER EXTREMITY SPECIAL TESTS:  Hip special tests: Luisa Hart (FABER) test: positive  and Thomas test: positive  Knee special tests: none this date, pt has reported R meniscal tear and has bad prior PT for this   FUNCTIONAL TESTS:  5 times sit to stand: 14.6 sec   GAIT: Distance walked: 30 ft Assistive device utilized: None Level of assistance: Complete Independence Comments: Hip ER, slight tredelenberg hip drop                                                                                                                                TREATMENT DATE: 03/23/24   SELF CARE Patient instructed in plan of care, findings for evaluation, and ways of physical therapy may improve their function and quality of life.     PATIENT EDUCATION: Education details: POC Person educated: Patient Education method: Explanation Education comprehension: verbalized understanding   HOME EXERCISE PROGRAM: Establish visit 2    ASSESSMENT:  CLINICAL IMPRESSION: Patient is a 63 year old female who presents to physical therapy for evaluation of bilateral hip pain.  Patient has pain in her bilateral anterior hips as well as her posterior right hip.  Patient's initial pain discomfort began when she began playing pickle ball in November and has continued to bother her with high-level activities such as playing with her grandkids or prolonged  ambulation.  Patient has deficits in hip strength bilaterally with increased deficits on the right versus the left as evidenced by muscle strength testing.  Patient also has limitations in hip mobility as evidenced by [facility]  and FADIR testing this date.  In addition patient's lower extremity functional scale score indicates significant limitations.  Patient will benefit from skilled physical therapy to improve her hip strength, improve her hip mobility, improve her hip pain, and improve her overall quality of life.  OBJECTIVE IMPAIRMENTS: Abnormal gait, decreased activity tolerance, decreased mobility, decreased strength, and pain.   ACTIVITY LIMITATIONS: squatting and locomotion level  PARTICIPATION LIMITATIONS: community activity and yard work  PERSONAL FACTORS: 3+ comorbidities: GERD, HLD, HTN  are also affecting patient's functional outcome.   REHAB POTENTIAL: Good  CLINICAL DECISION MAKING: Evolving/moderate complexity  EVALUATION COMPLEXITY: Moderate   GOALS: Goals reviewed with patient? Yes  SHORT TERM GOALS: Target date: 04/20/2024     Patient will be independent in home exercise program to improve strength/mobility for better functional independence with ADLs. Baseline: No HEP currently  Goal status: INITIAL    LONG TERM GOALS: Target date: 05/18/2024   1.  Patient  will complete five times sit to stand test in < 11 seconds indicating an increased LE strength and improved balance. Baseline: 14.6 s Goal status: INITIAL  2.  Patient will improve LEFS score to 44   to demonstrate statistically significant improvement in mobility and quality of life as it relates to their LE pain.  Baseline: 34 Goal status: INITIAL    3.  Patient will improve left and right hip abduction and flexion strength by 5 pounds of force or greater using active force stools in order to indicate improved lower extremity strength for activities such as pickleball. Baseline: see eval cahrt  Goal  status: INITIAL  4.  Patient report moderate difficulty or less difficulty with putting on her socks and shoes in order to indicate improved hip mobility for functional activities.  Baseline: quite a bit of difficulty  Goal status: INITIAL     PLAN:  PT FREQUENCY: 2x/week  PT DURATION: 8 weeks  PLANNED INTERVENTIONS: 97750- Physical Performance Testing, 97110-Therapeutic exercises, 97530- Therapeutic activity, 97112- Neuromuscular re-education, 97535- Self Care, 16109- Manual therapy, [phone]- Gait training, Patient/Family education, Stair training, Taping, Dry Needling, Joint mobilization, Spinal mobilization, Cryotherapy, and Moist heat  PLAN FOR NEXT SESSION: Hip mobility and initial strengthening interventions    Edwina Gram, PT 03/23/2024, 2:29 PM

## 2024-03-23 ENCOUNTER — Encounter: Payer: Self-pay | Admitting: Physical Therapy

## 2024-03-23 ENCOUNTER — Ambulatory Visit: Attending: Family Medicine | Admitting: Physical Therapy

## 2024-03-23 DIAGNOSIS — M25551 Pain in right hip: Secondary | ICD-10-CM | POA: Insufficient documentation

## 2024-03-23 DIAGNOSIS — M25552 Pain in left hip: Secondary | ICD-10-CM | POA: Insufficient documentation

## 2024-03-23 DIAGNOSIS — M6281 Muscle weakness (generalized): Secondary | ICD-10-CM | POA: Insufficient documentation

## 2024-03-23 DIAGNOSIS — M25651 Stiffness of right hip, not elsewhere classified: Secondary | ICD-10-CM | POA: Diagnosis present

## 2024-03-25 ENCOUNTER — Ambulatory Visit: Admitting: Physical Therapy

## 2024-03-29 ENCOUNTER — Ambulatory Visit: Admitting: Physical Therapy

## 2024-03-29 DIAGNOSIS — M6281 Muscle weakness (generalized): Secondary | ICD-10-CM

## 2024-03-29 DIAGNOSIS — M25551 Pain in right hip: Secondary | ICD-10-CM | POA: Diagnosis not present

## 2024-03-29 DIAGNOSIS — M25651 Stiffness of right hip, not elsewhere classified: Secondary | ICD-10-CM

## 2024-03-29 DIAGNOSIS — M25552 Pain in left hip: Secondary | ICD-10-CM

## 2024-03-29 NOTE — Therapy (Signed)
 OUTPATIENT PHYSICAL THERAPY LOWER EXTREMITY EVALUATION   Patient Name: Summer Hernandez MRN: [id number] DOB:08-Dec-1961, 63 y.o., female Today's Date: 03/29/2024  END OF SESSION:  PT End of Session - 03/29/24 0807     Visit Number 2    Number of Visits 16    Date for PT Re-Evaluation 05/18/24    Progress Note Due on Visit 10    PT Start Time 0804    PT Stop Time 0845    PT Time Calculation (min) 41 min    Activity Tolerance Patient tolerated treatment well    Behavior During Therapy [facility] for tasks assessed/performed             Past Medical History:  Diagnosis Date   GERD (gastroesophageal reflux disease)    Hyperlipidemia    Hypertension    Shortness of breath dyspnea    Thyroid  disease    Past Surgical History:  Procedure Laterality Date   CESAREAN SECTION     CHOLECYSTECTOMY  12/29/06   CHOLECYSTECTOMY, LAPAROSCOPIC     COLONOSCOPY N/A 04/27/2015   Procedure: COLONOSCOPY;  Surgeon: Cassie Click, MD;  Location: [facility] ENDOSCOPY;  Service: Endoscopy;  Laterality: N/A;   PLANTAR FASCIA SURGERY Right 12/08/2010   plantar fascitis     TUBAL LIGATION  01/18/86   Patient Active Problem List   Diagnosis Date Noted   Bee sting reaction 07/03/2020   Arthritis of carpometacarpal [facility]) joint of right thumb 01/24/2020   Bilateral hip pain 01/24/2020   Acute pain of left shoulder 01/20/2019   Acute pain of right knee 01/20/2019   Hypothyroidism 01/15/2018   ACNE ROSACEA 11/19/2010   HYPERCHOLESTEROLEMIA 05/16/2010   Essential hypertension, benign 02/04/2010    PCP:   Judithann Novas, MD    REFERRING PROVIDER:   Judithann Novas, MD    REFERRING DIAG: [phone] (ICD-10-CM) - Bilateral hip pain   THERAPY DIAG:  Pain in right hip  Pain in left hip  Stiffness of right hip, not elsewhere classified  Muscle weakness (generalized)  Rationale for Evaluation and Treatment: Rehabilitation  ONSET DATE: 10/2023  SUBJECTIVE:   SUBJECTIVE STATEMENT:  Pt  reports pain/stiffness 3/10 in anterior bil hip. States that she had good weekend, watching  granddaughter over the weekend.     Last fall pt began having pain in her right hip that descended into her right thigh. Pt MD thought it was bursitis and was prescribed prednisone  ( not effective) and exercises ( not completed per report). Pt reports rest is helpful but she likes to stay active. Pt likes to play pickle-ball and walk for exercise ( can't do at the moment due to her knee per her MD).   PERTINENT HISTORY: Pmx of GERD, HLD, HTN PAIN:  Are you having pain? Yes: NPRS scale: 1-2/10 Pain location: B hips anterior and R hip posterior Pain description: heaviness, awkwardness  Aggravating factors: increased activity Relieving factors: rest   PRECAUTIONS: None  RED FLAGS: None   WEIGHT BEARING RESTRICTIONS: No  FALLS:  Has patient fallen in last 6 months? No  LIVING ENVIRONMENT: Lives with: lives with their spouse Lives in: House/apartment Stairs: Yes: External: 16 steps; on right going up Has following equipment at home: None  OCCUPATION: Retired Engineer, site, working part time at [employer]   PLOF: Independent  PATIENT GOALS: To improve pain and improve activity tolerance   NEXT MD VISIT: No follow up scheduled   OBJECTIVE:  Note: Objective measures were completed at Evaluation unless  otherwise noted.  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    PATIENT SURVEYS:  LEFS 34  COGNITION: Overall cognitive status: Within functional limits for tasks assessed     SENSATION: Not tested  EDEMA:  None present   MUSCLE LENGTH: Hamstrings: Right 35 deg; WNL Thomas test:Post R>L for quad tightness     PALPATION: Assess next visit if indicated   LOWER EXTREMITY ZOX:WRUEAVW Hip IR and ER in future session  P ROM Right eval Left eval  Hip flexion WNL WNL  Hip extension    Hip  abduction    Hip adduction    Hip internal rotation    Hip external rotation    Knee flexion WNL WNL  Knee extension WNL WNL  Ankle dorsiflexion    Ankle plantarflexion    Ankle inversion    Ankle eversion     (Blank rows = not tested)  LOWER EXTREMITY MMT: -used active force and took pek muscle force and listed below # = lb  MMT Right eval Left eval  Hip flexion -supine 12.6#* 25#  Hip extension- prone 17.5# 21.3#  Hip abduction - SL 14.7# 22#  Hip adduction    Hip internal rotation    Hip external rotation    Knee flexion    Knee extension 26.2 #* 29#  Ankle dorsiflexion 29.4# 29.3#  Ankle plantarflexion    Ankle inversion    Ankle eversion     (Blank rows = not tested)  LOWER EXTREMITY SPECIAL TESTS:  Hip special tests: Portia Brittle (FABER) test: positive  and Thomas test: positive  Knee special tests: none this date, pt has reported R meniscal tear and has bad prior PT for this   FUNCTIONAL TESTS:  5 times sit to stand: 14.6 sec   GAIT: Distance walked: 30 ft Assistive device utilized: None Level of assistance: Complete Independence Comments: Hip ER, slight tredelenberg hip drop                                                                                                                                TREATMENT DATE: 03/29/24   Nustep level 1 x 4 min cues for full ROM and consistent steps per min.   Standing hip flexor stretch 2 x 20 sec  Seated HS stretch 2 x 20 sec  Seated piriformis/figure 4 stretch 2 x 20 sec  Supine thomas stretch 2 x 20 sec  Supine self resisted hip flexion 5 sec hold x 10 bil  Supine clam shell x 12  SLR x 10 bil Quad sets x 12 bil    Manual:  STM to the R thigh and anterior hip with TP release for the R TFL and proximal rectus femoris x 8 min total.    PATIENT EDUCATION: Education details: POC Person educated: Patient Education method: Explanation Education comprehension: verbalized understanding   HOME EXERCISE  PROGRAM: Establish next visit  ASSESSMENT:  CLINICAL IMPRESSION: Patient is a 63 year old female who presents to physical therapy for evaluation of bilateral hip pain.  PT treatment focused on improved muscle extensibility and proper muscle activation with self performed stretching for hip complex. STM for pain management as listed for the R hip flexors and quads. Pt reports decreased tightness in R hip upon completion. Patient will benefit from skilled physical therapy to improve her hip strength, improve her hip mobility, improve her hip pain, and improve her overall quality of life.  OBJECTIVE IMPAIRMENTS: Abnormal gait, decreased activity tolerance, decreased mobility, decreased strength, and pain.   ACTIVITY LIMITATIONS: squatting and locomotion level  PARTICIPATION LIMITATIONS: community activity and yard work  PERSONAL FACTORS: 3+ comorbidities: GERD, HLD, HTN  are also affecting patient's functional outcome.   REHAB POTENTIAL: Good  CLINICAL DECISION MAKING: Evolving/moderate complexity  EVALUATION COMPLEXITY: Moderate   GOALS: Goals reviewed with patient? Yes  SHORT TERM GOALS: Target date: 04/20/2024     Patient will be independent in home exercise program to improve strength/mobility for better functional independence with ADLs. Baseline: No HEP currently  Goal status: INITIAL    LONG TERM GOALS: Target date: 05/18/2024   1.  Patient  will complete five times sit to stand test in < 11 seconds indicating an increased LE strength and improved balance. Baseline: 14.6 s Goal status: INITIAL  2.  Patient will improve LEFS score to 44   to demonstrate statistically significant improvement in mobility and quality of life as it relates to their LE pain.  Baseline: 34 Goal status: INITIAL    3.  Patient will improve left and right hip abduction and flexion strength by 5 pounds of force or greater using active force stools in order to indicate improved lower  extremity strength for activities such as pickleball. Baseline: see eval cahrt  Goal status: INITIAL  4.  Patient report moderate difficulty or less difficulty with putting on her socks and shoes in order to indicate improved hip mobility for functional activities.  Baseline: quite a bit of difficulty  Goal status: INITIAL     PLAN:  PT FREQUENCY: 2x/week  PT DURATION: 8 weeks  PLANNED INTERVENTIONS: 97750- Physical Performance Testing, 97110-Therapeutic exercises, 97530- Therapeutic activity, 97112- Neuromuscular re-education, 97535- Self Care, 16109- Manual therapy, [phone]- Gait training, Patient/Family education, Stair training, Taping, Dry Needling, Joint mobilization, Spinal mobilization, Cryotherapy, and Moist heat  PLAN FOR NEXT SESSION:   Hip mobility and initial strengthening interventions  Provide HEP hand out   Barbara Book, PT 03/29/2024, 8:08 AM

## 2024-03-30 ENCOUNTER — Other Ambulatory Visit: Payer: Self-pay | Admitting: Family Medicine

## 2024-03-31 ENCOUNTER — Ambulatory Visit: Admitting: Physical Therapy

## 2024-03-31 DIAGNOSIS — M25552 Pain in left hip: Secondary | ICD-10-CM

## 2024-03-31 DIAGNOSIS — M25651 Stiffness of right hip, not elsewhere classified: Secondary | ICD-10-CM

## 2024-03-31 DIAGNOSIS — M25551 Pain in right hip: Secondary | ICD-10-CM | POA: Diagnosis not present

## 2024-03-31 DIAGNOSIS — M6281 Muscle weakness (generalized): Secondary | ICD-10-CM

## 2024-03-31 NOTE — Therapy (Signed)
 OUTPATIENT PHYSICAL THERAPY LOWER EXTREMITY EVALUATION   Patient Name: Summer Hernandez MRN: [id number] DOB:16-May-1961, 63 y.o., female Today's Date: 03/31/2024  END OF SESSION:  PT End of Session - 03/31/24 1139     Visit Number 3    Number of Visits 16    Date for PT Re-Evaluation 05/18/24    Progress Note Due on Visit 10    PT Start Time 1143    PT Stop Time 1223    PT Time Calculation (min) 40 min    Activity Tolerance Patient tolerated treatment well    Behavior During Therapy [facility] for tasks assessed/performed             Past Medical History:  Diagnosis Date   GERD (gastroesophageal reflux disease)    Hyperlipidemia    Hypertension    Shortness of breath dyspnea    Thyroid  disease    Past Surgical History:  Procedure Laterality Date   CESAREAN SECTION     CHOLECYSTECTOMY  12/29/06   CHOLECYSTECTOMY, LAPAROSCOPIC     COLONOSCOPY N/A 04/27/2015   Procedure: COLONOSCOPY;  Surgeon: Cassie Click, MD;  Location: [facility] ENDOSCOPY;  Service: Endoscopy;  Laterality: N/A;   PLANTAR FASCIA SURGERY Right 12/08/2010   plantar fascitis     TUBAL LIGATION  01/18/86   Patient Active Problem List   Diagnosis Date Noted   Bee sting reaction 07/03/2020   Arthritis of carpometacarpal [facility]) joint of right thumb 01/24/2020   Bilateral hip pain 01/24/2020   Acute pain of left shoulder 01/20/2019   Acute pain of right knee 01/20/2019   Hypothyroidism 01/15/2018   ACNE ROSACEA 11/19/2010   HYPERCHOLESTEROLEMIA 05/16/2010   Essential hypertension, benign 02/04/2010    PCP:   Judithann Novas, MD    REFERRING PROVIDER:   Judithann Novas, MD    REFERRING DIAG: [phone] (ICD-10-CM) - Bilateral hip pain   THERAPY DIAG:  Pain in right hip  Pain in left hip  Stiffness of right hip, not elsewhere classified  Muscle weakness (generalized)  Rationale for Evaluation and Treatment: Rehabilitation  ONSET DATE: 10/2023  SUBJECTIVE:   SUBJECTIVE STATEMENT:  Pt  reports that she played some pickleball this morning for roughly an hour. Reports pain 4/10 with movement in BLE, but greater on the R, 0/10 at rest.    Last fall pt began having pain in her right hip that descended into her right thigh. Pt MD thought it was bursitis and was prescribed prednisone  ( not effective) and exercises ( not completed per report). Pt reports rest is helpful but she likes to stay active. Pt likes to play pickle-ball and walk for exercise ( can't do at the moment due to her knee per her MD).   PERTINENT HISTORY: Pmx of GERD, HLD, HTN PAIN:  Are you having pain? Yes: NPRS scale: 4/10 with movement. Pain location: B hips anterior, but greater on the R hip posterior Pain description: heaviness, awkwardness  Aggravating factors: increased activity Relieving factors: rest   PRECAUTIONS: None  RED FLAGS: None   WEIGHT BEARING RESTRICTIONS: No  FALLS:  Has patient fallen in last 6 months? No  LIVING ENVIRONMENT: Lives with: lives with their spouse Lives in: House/apartment Stairs: Yes: External: 16 steps; on right going up Has following equipment at home: None  OCCUPATION: Retired Engineer, site, working part time at [employer]   PLOF: Independent  PATIENT GOALS: To improve pain and improve activity tolerance   NEXT MD VISIT: No follow up scheduled  OBJECTIVE:  Note: Objective measures were completed at Evaluation unless otherwise noted.  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    PATIENT SURVEYS:  LEFS 34  COGNITION: Overall cognitive status: Within functional limits for tasks assessed     SENSATION: Not tested  EDEMA:  None present   MUSCLE LENGTH: Hamstrings: Right 35 deg; WNL Thomas test:Post R>L for quad tightness     PALPATION: Assess next visit if indicated   LOWER EXTREMITY WUJ:[id number] Hip IR and ER in future session  P ROM Right eval  Left eval  Hip flexion WNL WNL  Hip extension    Hip abduction    Hip adduction    Hip internal rotation    Hip external rotation    Knee flexion WNL WNL  Knee extension WNL WNL  Ankle dorsiflexion    Ankle plantarflexion    Ankle inversion    Ankle eversion     (Blank rows = not tested)  LOWER EXTREMITY MMT: -used active force and took pek muscle force and listed below # = lb  MMT Right eval Left eval  Hip flexion -supine 12.6#* 25#  Hip extension- prone 17.5# 21.3#  Hip abduction - SL 14.7# 22#  Hip adduction    Hip internal rotation    Hip external rotation    Knee flexion    Knee extension 26.2 #* 29#  Ankle dorsiflexion 29.4# 29.3#  Ankle plantarflexion    Ankle inversion    Ankle eversion     (Blank rows = not tested)  LOWER EXTREMITY SPECIAL TESTS:  Hip special tests: Portia Brittle (FABER) test: positive  and Thomas test: positive  Knee special tests: none this date, pt has reported R meniscal tear and has bad prior PT for this   FUNCTIONAL TESTS:  5 times sit to stand: 14.6 sec   GAIT: Distance walked: 30 ft Assistive device utilized: None Level of assistance: Complete Independence Comments: Hip ER, slight tredelenberg hip drop                                                                                                                                TREATMENT DATE: 03/31/24   Standing hip flexion extension active swing X 10 bi   Openthe gate. X 10 bil  Close the gate x 10 bil  Alternating knee flexion AROM x 10  Standing calf raise from half bolster  x10    Supine clam shell 2x 12  Bridge 2 x 10 SLR 2 x 12  Sidelying hip abduction x 10 bil  Sidelying clam shell RTB LLE, AROM on the RLE>  Prone hip extension x 10 bil  HS curl prone x 10 bil  Contract relax hip extension stretch   Manual:  STM to the R glute/piriformis x 8 min total  with TP release to glute max and piriformis.  Trigger Point Dry Needling  Initial Treatment:  Pt instructed on Dry  Needling rational, procedures, and possible side effects. Pt instructed to expect mild to moderate muscle soreness later in the day and/or into the next day.  Pt instructed in methods to reduce muscle soreness. Pt instructed to continue prescribed HEP. Patient was educated on signs and symptoms of infection and other risk factors and advised to seek medical attention should they occur.  Patient verbalized understanding of these instructions and education.   Patient Verbal Consent Given: Yes Education Handout Provided: Yes Muscles Treated: R Glute max/med and piriformis  Electrical Stimulation Performed: No Treatment Response/Outcome: mild twitch noted.       PATIENT EDUCATION: Education details: POC Person educated: Patient Education method: Explanation Education comprehension: verbalized understanding   HOME EXERCISE PROGRAM: Establish next visit    ASSESSMENT:  CLINICAL IMPRESSION: Patient is a 63 year old female who presents to physical therapy for evaluation of bilateral hip pain.  PT treatment focused on gluteal activation and improved ROM in BLE hips. PT performed TDN for trigger point management in the R glutes/piriformis. Pt tolerated treatment well. Patient will benefit from skilled physical therapy to improve her hip strength, improve her hip mobility, improve her hip pain, and improve her overall quality of life.  OBJECTIVE IMPAIRMENTS: Abnormal gait, decreased activity tolerance, decreased mobility, decreased strength, and pain.   ACTIVITY LIMITATIONS: squatting and locomotion level  PARTICIPATION LIMITATIONS: community activity and yard work  PERSONAL FACTORS: 3+ comorbidities: GERD, HLD, HTN  are also affecting patient's functional outcome.   REHAB POTENTIAL: Good  CLINICAL DECISION MAKING: Evolving/moderate complexity  EVALUATION COMPLEXITY: Moderate   GOALS: Goals reviewed with patient? Yes  SHORT TERM GOALS: Target date: 04/20/2024     Patient  will be independent in home exercise program to improve strength/mobility for better functional independence with ADLs. Baseline: No HEP currently  Goal status: INITIAL    LONG TERM GOALS: Target date: 05/18/2024   1.  Patient  will complete five times sit to stand test in < 11 seconds indicating an increased LE strength and improved balance. Baseline: 14.6 s Goal status: INITIAL  2.  Patient will improve LEFS score to 44   to demonstrate statistically significant improvement in mobility and quality of life as it relates to their LE pain.  Baseline: 34 Goal status: INITIAL    3.  Patient will improve left and right hip abduction and flexion strength by 5 pounds of force or greater using active force stools in order to indicate improved lower extremity strength for activities such as pickleball. Baseline: see eval cahrt  Goal status: INITIAL  4.  Patient report moderate difficulty or less difficulty with putting on her socks and shoes in order to indicate improved hip mobility for functional activities.  Baseline: quite a bit of difficulty  Goal status: INITIAL     PLAN:  PT FREQUENCY: 2x/week  PT DURATION: 8 weeks  PLANNED INTERVENTIONS: 97750- Physical Performance Testing, 97110-Therapeutic exercises, 97530- Therapeutic activity, 97112- Neuromuscular re-education, 97535- Self Care, 09811- Manual therapy, [phone]- Gait training, Patient/Family education, Stair training, Taping, Dry Needling, Joint mobilization, Spinal mobilization, Cryotherapy, and Moist heat  PLAN FOR NEXT SESSION:   Hip mobility continue strengthening interventions  Provide HEP hand out for stretching.   Barbara Book, PT 03/31/2024, 11:40 AM

## 2024-04-05 ENCOUNTER — Ambulatory Visit: Admitting: Physical Therapy

## 2024-04-05 DIAGNOSIS — M6281 Muscle weakness (generalized): Secondary | ICD-10-CM

## 2024-04-05 DIAGNOSIS — M25551 Pain in right hip: Secondary | ICD-10-CM

## 2024-04-05 DIAGNOSIS — M25552 Pain in left hip: Secondary | ICD-10-CM

## 2024-04-05 DIAGNOSIS — M25651 Stiffness of right hip, not elsewhere classified: Secondary | ICD-10-CM

## 2024-04-05 NOTE — Therapy (Signed)
 OUTPATIENT PHYSICAL THERAPY LOWER EXTREMITY EVALUATION   Patient Name: Summer Hernandez MRN: [id number] DOB:Apr 14, 1961, 63 y.o., female Today's Date: 04/05/2024  END OF SESSION:  PT End of Session - 04/05/24 0803     Visit Number 4    Number of Visits 16    Date for PT Re-Evaluation 05/18/24    Progress Note Due on Visit 10    PT Start Time 0805    PT Stop Time 0845    PT Time Calculation (min) 40 min    Activity Tolerance Patient tolerated treatment well    Behavior During Therapy [facility] for tasks assessed/performed             Past Medical History:  Diagnosis Date   GERD (gastroesophageal reflux disease)    Hyperlipidemia    Hypertension    Shortness of breath dyspnea    Thyroid  disease    Past Surgical History:  Procedure Laterality Date   CESAREAN SECTION     CHOLECYSTECTOMY  12/29/06   CHOLECYSTECTOMY, LAPAROSCOPIC     COLONOSCOPY N/A 04/27/2015   Procedure: COLONOSCOPY;  Surgeon: Cassie Click, MD;  Location: [facility] ENDOSCOPY;  Service: Endoscopy;  Laterality: N/A;   PLANTAR FASCIA SURGERY Right 12/08/2010   plantar fascitis     TUBAL LIGATION  01/18/86   Patient Active Problem List   Diagnosis Date Noted   Bee sting reaction 07/03/2020   Arthritis of carpometacarpal [facility]) joint of right thumb 01/24/2020   Bilateral hip pain 01/24/2020   Acute pain of left shoulder 01/20/2019   Acute pain of right knee 01/20/2019   Hypothyroidism 01/15/2018   ACNE ROSACEA 11/19/2010   HYPERCHOLESTEROLEMIA 05/16/2010   Essential hypertension, benign 02/04/2010    PCP:   Judithann Novas, MD    REFERRING PROVIDER:   Judithann Novas, MD    REFERRING DIAG: [phone] (ICD-10-CM) - Bilateral hip pain   THERAPY DIAG:  Pain in right hip  Pain in left hip  Stiffness of right hip, not elsewhere classified  Muscle weakness (generalized)  Rationale for Evaluation and Treatment: Rehabilitation  ONSET DATE: 10/2023  SUBJECTIVE:   SUBJECTIVE STATEMENT:  Pt  reports that she had a good weekend, and was able to put pine needles around the house without pain. States that yesterday was a busy exercising in the morning following a fitness app workout and she had to climb up down ladder at work, causing some soreness in R hip. Pain then limited her sleep last night.   Eval:  Last fall pt began having pain in her right hip that descended into her right thigh. Pt MD thought it was bursitis and was prescribed prednisone  ( not effective) and exercises ( not completed per report). Pt reports rest is helpful but she likes to stay active. Pt likes to play pickle-ball and walk for exercise ( can't do at the moment due to her knee per her MD).   PERTINENT HISTORY: Pmx of GERD, HLD, HTN PAIN:  Are you having pain? Yes: NPRS scale: 4/10 with movement. Pain location: B hips anterior, but greater on the R hip posterior Pain description: heaviness, awkwardness  Aggravating factors: increased activity Relieving factors: rest   PRECAUTIONS: None  RED FLAGS: None   WEIGHT BEARING RESTRICTIONS: No  FALLS:  Has patient fallen in last 6 months? No  LIVING ENVIRONMENT: Lives with: lives with their spouse Lives in: House/apartment Stairs: Yes: External: 16 steps; on right going up Has following equipment at home: None  OCCUPATION: Retired school  teacher, working part time at [employer]   PLOF: Independent  PATIENT GOALS: To improve pain and improve activity tolerance   NEXT MD VISIT: No follow up scheduled   OBJECTIVE:  Note: Objective measures were completed at Evaluation unless otherwise noted.  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    PATIENT SURVEYS:  LEFS 34  COGNITION: Overall cognitive status: Within functional limits for tasks assessed     SENSATION: Not tested  EDEMA:  None present   MUSCLE LENGTH: Hamstrings: Right 35 deg; WNL Thomas test:Post R>L  for quad tightness     PALPATION: Assess next visit if indicated   LOWER EXTREMITY ZOX:WRUEAVW Hip IR and ER in future session  P ROM Right eval Left eval  Hip flexion WNL WNL  Hip extension    Hip abduction    Hip adduction    Hip internal rotation    Hip external rotation    Knee flexion WNL WNL  Knee extension WNL WNL  Ankle dorsiflexion    Ankle plantarflexion    Ankle inversion    Ankle eversion     (Blank rows = not tested)  LOWER EXTREMITY MMT: -used active force and took pek muscle force and listed below # = lb  MMT Right eval Left eval  Hip flexion -supine 12.6#* 25#  Hip extension- prone 17.5# 21.3#  Hip abduction - SL 14.7# 22#  Hip adduction    Hip internal rotation    Hip external rotation    Knee flexion    Knee extension 26.2 #* 29#  Ankle dorsiflexion 29.4# 29.3#  Ankle plantarflexion    Ankle inversion    Ankle eversion     (Blank rows = not tested)  LOWER EXTREMITY SPECIAL TESTS:  Hip special tests: Portia Brittle (FABER) test: positive  and Thomas test: positive  Knee special tests: none this date, pt has reported R meniscal tear and has bad prior PT for this   FUNCTIONAL TESTS:  5 times sit to stand: 14.6 sec   GAIT: Distance walked: 30 ft Assistive device utilized: None Level of assistance: Complete Independence Comments: Hip ER, slight tredelenberg hip drop                                                                                                                                TREATMENT DATE: 04/05/24   Standing hip flexion extension active swing X 10 bi   Openthe gate. X 10 bil  Close the gate x 10 bil  Alternating knee flexion  with 2-3 sec hold AROM x 10  Standing calf stretch 3 sec hold x 10 bil  Standing HS stretch 3 sec hold x 5 bil  Hip flexion stretch 8 x 3 sec hold bil      Supine clam shell 2x 12  Bridge 2 x 10 Bridhe with hip abduction RTB x 10  HS curl/bridge  with straight leg x 8 on peanut.    Manual:  STM to  the R glute/piriformis x 3 min total  with TP release to glute med and TFL.        PATIENT EDUCATION: Education details: POC. Pt educated throughout session about proper posture and technique with exercises. Improved exercise technique, movement at target joints, use of target muscles after min to mod verbal, visual, tactile cues. - Importance of warm up and cool down with Physical activity to reduce residual soreness and pain/injury.   Person educated: Patient Education method: Explanation Education comprehension: verbalized understanding   HOME EXERCISE PROGRAM: Provided 4/24.    ASSESSMENT:  CLINICAL IMPRESSION: Patient is a 63 year old female who presents to physical therapy for evaluation of bilateral hip pain. PT treatment focused on improved warm up technique for physical activation and education on the benefits of stretching for cool down to reduce pain and soreness. Pt demonstrates proper technique and increased ROM for all interventions with verbalized understanding of the importance of proper warm up and cool down prior to phyiscal activity. Patient will benefit from skilled physical therapy to improve her hip strength, improve her hip mobility, improve her hip pain, and improve her overall quality of life.  OBJECTIVE IMPAIRMENTS: Abnormal gait, decreased activity tolerance, decreased mobility, decreased strength, and pain.   ACTIVITY LIMITATIONS: squatting and locomotion level  PARTICIPATION LIMITATIONS: community activity and yard work  PERSONAL FACTORS: 3+ comorbidities: GERD, HLD, HTN  are also affecting patient's functional outcome.   REHAB POTENTIAL: Good  CLINICAL DECISION MAKING: Evolving/moderate complexity  EVALUATION COMPLEXITY: Moderate   GOALS: Goals reviewed with patient? Yes  SHORT TERM GOALS: Target date: 04/20/2024     Patient will be independent in home exercise program to improve strength/mobility for better functional independence with  ADLs. Baseline: No HEP currently  Goal status: INITIAL    LONG TERM GOALS: Target date: 05/18/2024   1.  Patient  will complete five times sit to stand test in < 11 seconds indicating an increased LE strength and improved balance. Baseline: 14.6 s Goal status: INITIAL  2.  Patient will improve LEFS score to 44   to demonstrate statistically significant improvement in mobility and quality of life as it relates to their LE pain.  Baseline: 34 Goal status: INITIAL    3.  Patient will improve left and right hip abduction and flexion strength by 5 pounds of force or greater using active force stools in order to indicate improved lower extremity strength for activities such as pickleball. Baseline: see eval cahrt  Goal status: INITIAL  4.  Patient report moderate difficulty or less difficulty with putting on her socks and shoes in order to indicate improved hip mobility for functional activities.  Baseline: quite a bit of difficulty  Goal status: INITIAL     PLAN:  PT FREQUENCY: 2x/week  PT DURATION: 8 weeks  PLANNED INTERVENTIONS: 97750- Physical Performance Testing, 97110-Therapeutic exercises, 97530- Therapeutic activity, 97112- Neuromuscular re-education, 97535- Self Care, 16109- Manual therapy, [phone]- Gait training, Patient/Family education, Stair training, Taping, Dry Needling, Joint mobilization, Spinal mobilization, Cryotherapy, and Moist heat  PLAN FOR NEXT SESSION:   Hip mobility continue strengthening interventions  Provide HEP hand out for stretching.  TDN for trigger point release in the R hip.   Barbara Book, PT 04/05/2024, 8:03 AM

## 2024-04-08 ENCOUNTER — Ambulatory Visit: Admitting: Physical Therapy

## 2024-04-08 ENCOUNTER — Ambulatory Visit: Attending: Family Medicine | Admitting: Physical Therapy

## 2024-04-08 DIAGNOSIS — M25552 Pain in left hip: Secondary | ICD-10-CM | POA: Insufficient documentation

## 2024-04-08 DIAGNOSIS — M6281 Muscle weakness (generalized): Secondary | ICD-10-CM | POA: Diagnosis present

## 2024-04-08 DIAGNOSIS — M25651 Stiffness of right hip, not elsewhere classified: Secondary | ICD-10-CM | POA: Diagnosis present

## 2024-04-08 DIAGNOSIS — M25551 Pain in right hip: Secondary | ICD-10-CM | POA: Diagnosis present

## 2024-04-08 NOTE — Therapy (Signed)
 OUTPATIENT PHYSICAL THERAPY LOWER EXTREMITY EVALUATION   Patient Name: Summer Hernandez MRN: [id number] DOB:1961/10/01, 63 y.o., female Today's Date: 04/08/2024  END OF SESSION:  PT End of Session - 04/08/24 0854     Visit Number 5    Number of Visits 16    Date for PT Re-Evaluation 05/18/24    Progress Note Due on Visit 10    PT Start Time 0847    PT Stop Time 0927    PT Time Calculation (min) 40 min    Activity Tolerance Patient tolerated treatment well    Behavior During Therapy [facility] for tasks assessed/performed             Past Medical History:  Diagnosis Date   GERD (gastroesophageal reflux disease)    Hyperlipidemia    Hypertension    Shortness of breath dyspnea    Thyroid  disease    Past Surgical History:  Procedure Laterality Date   CESAREAN SECTION     CHOLECYSTECTOMY  12/29/06   CHOLECYSTECTOMY, LAPAROSCOPIC     COLONOSCOPY N/A 04/27/2015   Procedure: COLONOSCOPY;  Surgeon: Cassie Click, MD;  Location: [facility] ENDOSCOPY;  Service: Endoscopy;  Laterality: N/A;   PLANTAR FASCIA SURGERY Right 12/08/2010   plantar fascitis     TUBAL LIGATION  01/18/86   Patient Active Problem List   Diagnosis Date Noted   Bee sting reaction 07/03/2020   Arthritis of carpometacarpal [facility]) joint of right thumb 01/24/2020   Bilateral hip pain 01/24/2020   Acute pain of left shoulder 01/20/2019   Acute pain of right knee 01/20/2019   Hypothyroidism 01/15/2018   ACNE ROSACEA 11/19/2010   HYPERCHOLESTEROLEMIA 05/16/2010   Essential hypertension, benign 02/04/2010    PCP:   Judithann Novas, MD    REFERRING PROVIDER:   Judithann Novas, MD    REFERRING DIAG: [phone] (ICD-10-CM) - Bilateral hip pain   THERAPY DIAG:  Pain in right hip  Pain in left hip  Stiffness of right hip, not elsewhere classified  Muscle weakness (generalized)  Rationale for Evaluation and Treatment: Rehabilitation  ONSET DATE: 10/2023  SUBJECTIVE:   SUBJECTIVE STATEMENT:  Pt  reports that she had a good week, but had new R anterior thigh/ groin pain that has felt like burning/numbness at time. Is not constant and will increase/decrease depending on position  Eval:  Last fall pt began having pain in her right hip that descended into her right thigh. Pt MD thought it was bursitis and was prescribed prednisone  ( not effective) and exercises ( not completed per report). Pt reports rest is helpful but she likes to stay active. Pt likes to play pickle-ball and walk for exercise ( can't do at the moment due to her knee per her MD).   PERTINENT HISTORY: Pmx of GERD, HLD, HTN PAIN:  Are you having pain? Yes: NPRS scale: 4/10 with movement. Pain location: B hips anterior, but greater on the R hip posterior Pain description: heaviness, awkwardness  Aggravating factors: increased activity Relieving factors: rest   PRECAUTIONS: None  RED FLAGS: None   WEIGHT BEARING RESTRICTIONS: No  FALLS:  Has patient fallen in last 6 months? No  LIVING ENVIRONMENT: Lives with: lives with their spouse Lives in: House/apartment Stairs: Yes: External: 16 steps; on right going up Has following equipment at home: None  OCCUPATION: Retired Engineer, site, working part time at [employer]   PLOF: Independent  PATIENT GOALS: To improve pain and improve activity tolerance   NEXT MD VISIT:  No follow up scheduled   OBJECTIVE:  Note: Objective measures were completed at Evaluation unless otherwise noted.  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    PATIENT SURVEYS:  LEFS 34  COGNITION: Overall cognitive status: Within functional limits for tasks assessed     SENSATION: Not tested  EDEMA:  None present   MUSCLE LENGTH: Hamstrings: Right 35 deg; WNL Thomas test:Post R>L for quad tightness     PALPATION: Assess next visit if indicated   LOWER EXTREMITY ZOX:WRUEAVW Hip IR and ER in future  session  P ROM Right eval Left eval  Hip flexion WNL WNL  Hip extension    Hip abduction    Hip adduction    Hip internal rotation    Hip external rotation    Knee flexion WNL WNL  Knee extension WNL WNL  Ankle dorsiflexion    Ankle plantarflexion    Ankle inversion    Ankle eversion     (Blank rows = not tested)  LOWER EXTREMITY MMT: -used active force and took pek muscle force and listed below # = lb  MMT Right eval Left eval  Hip flexion -supine 12.6#* 25#  Hip extension- prone 17.5# 21.3#  Hip abduction - SL 14.7# 22#  Hip adduction    Hip internal rotation    Hip external rotation    Knee flexion    Knee extension 26.2 #* 29#  Ankle dorsiflexion 29.4# 29.3#  Ankle plantarflexion    Ankle inversion    Ankle eversion     (Blank rows = not tested)  LOWER EXTREMITY SPECIAL TESTS:  Hip special tests: Portia Brittle (FABER) test: positive  and Thomas test: positive  Knee special tests: none this date, pt has reported R meniscal tear and has bad prior PT for this   FUNCTIONAL TESTS:  5 times sit to stand: 14.6 sec   GAIT: Distance walked: 30 ft Assistive device utilized: None Level of assistance: Complete Independence Comments: Hip ER, slight tredelenberg hip drop                                                                                                                                TREATMENT DATE: 04/08/24  Seated HS stretch 2 x 25 sec bil  Standing HIPflexor stretch 15 sec x 3 on stairs.  Supine piriformis stretch x 20 sce bil  Pelvic rock x 12  Bridge with hip abduction x 12  LTR x 10 with 3 sec hold.  SLR x 8 bil  Sidelying hip abduction AROM x 12 Sidelying clam shell RTB x 12   Manual:  STM for lumbar paraspinal tightness x 5 with to TP release x 83m in each for 2 TP noted Grade 2 UPA to the L paraspinals L1-L4 x 30 sec per segment.  STM oyt R side gluteal with TP noted on glute max/pirifomis insertion, Glute med.  Trigger Point  Dry  Needling  Subsequent Treatment: Instructions provided previously at initial dry needling treatment.   Patient Verbal Consent Given: Yes Education Handout Provided: Previously Provided Muscles Treated: GLute max/glute med Electrical Stimulation Performed: No Treatment Response/Outcome: twictch in glute max.      PATIENT EDUCATION: Education details: POC. Pt educated throughout session about proper posture and technique with exercises. Improved exercise technique, movement at target joints, use of target muscles after min to mod verbal, visual, tactile cues. - Importance of warm up and cool down with Physical activity to reduce residual soreness and pain/injury.   Person educated: Patient Education method: Explanation Education comprehension: verbalized understanding   HOME EXERCISE PROGRAM: Provided 4/24.    ASSESSMENT:  CLINICAL IMPRESSION: Patient is a 63 year old female who presents to physical therapy for evaluation of bilateral hip pain. PT treatment focused on improved gluteal activation and hip mobility to reduce tension on lumbar spine with dynamic movement. Education for neurologic s/s into the R hip/thigh. TDN for R hip tightness and improved muscle extensibility to reduce lumbar compensations. Patient will benefit from skilled physical therapy to improve her hip strength, improve her hip mobility, improve her hip pain, and improve her overall quality of life.  OBJECTIVE IMPAIRMENTS: Abnormal gait, decreased activity tolerance, decreased mobility, decreased strength, and pain.   ACTIVITY LIMITATIONS: squatting and locomotion level  PARTICIPATION LIMITATIONS: community activity and yard work  PERSONAL FACTORS: 3+ comorbidities: GERD, HLD, HTN  are also affecting patient's functional outcome.   REHAB POTENTIAL: Good  CLINICAL DECISION MAKING: Evolving/moderate complexity  EVALUATION COMPLEXITY: Moderate   GOALS: Goals reviewed with patient? Yes  SHORT TERM  GOALS: Target date: 04/20/2024     Patient will be independent in home exercise program to improve strength/mobility for better functional independence with ADLs. Baseline: No HEP currently  Goal status: INITIAL    LONG TERM GOALS: Target date: 05/18/2024   1.  Patient  will complete five times sit to stand test in < 11 seconds indicating an increased LE strength and improved balance. Baseline: 14.6 s Goal status: INITIAL  2.  Patient will improve LEFS score to 44   to demonstrate statistically significant improvement in mobility and quality of life as it relates to their LE pain.  Baseline: 34 Goal status: INITIAL    3.  Patient will improve left and right hip abduction and flexion strength by 5 pounds of force or greater using active force stools in order to indicate improved lower extremity strength for activities such as pickleball. Baseline: see eval cahrt  Goal status: INITIAL  4.  Patient report moderate difficulty or less difficulty with putting on her socks and shoes in order to indicate improved hip mobility for functional activities.  Baseline: quite a bit of difficulty  Goal status: INITIAL     PLAN:  PT FREQUENCY: 2x/week  PT DURATION: 8 weeks  PLANNED INTERVENTIONS: 97750- Physical Performance Testing, 97110-Therapeutic exercises, 97530- Therapeutic activity, 97112- Neuromuscular re-education, 97535- Self Care, 52841- Manual therapy, [phone]- Gait training, Patient/Family education, Stair training, Taping, Dry Needling, Joint mobilization, Spinal mobilization, Cryotherapy, and Moist heat  PLAN FOR NEXT SESSION:   Hip mobility continue strengthening interventions  Lumbar stability.    Barbara Book, PT 04/08/2024, 8:55 AM

## 2024-04-12 ENCOUNTER — Encounter: Payer: Self-pay | Admitting: Family Medicine

## 2024-04-12 ENCOUNTER — Ambulatory Visit

## 2024-04-12 DIAGNOSIS — M25551 Pain in right hip: Secondary | ICD-10-CM

## 2024-04-12 DIAGNOSIS — M25651 Stiffness of right hip, not elsewhere classified: Secondary | ICD-10-CM

## 2024-04-12 DIAGNOSIS — M25552 Pain in left hip: Secondary | ICD-10-CM

## 2024-04-12 NOTE — Therapy (Signed)
 OUTPATIENT PHYSICAL THERAPY LOWER EXTREMITY TREATMENT   Patient Name: Summer Hernandez MRN: [id number] DOB:Oct 21, 1961, 63 y.o., female Today's Date: 04/12/2024  END OF SESSION:  PT End of Session - 04/12/24 1015     Visit Number 6    Number of Visits 16    Date for PT Re-Evaluation 05/18/24    Progress Note Due on Visit 10    PT Start Time 1015    PT Stop Time 1058    PT Time Calculation (min) 43 min    Activity Tolerance Patient tolerated treatment well    Behavior During Therapy WFL for tasks assessed/performed              Past Medical History:  Diagnosis Date   GERD (gastroesophageal reflux disease)    Hyperlipidemia    Hypertension    Shortness of breath dyspnea    Thyroid  disease    Past Surgical History:  Procedure Laterality Date   CESAREAN SECTION     CHOLECYSTECTOMY  12/29/06   CHOLECYSTECTOMY, LAPAROSCOPIC     COLONOSCOPY N/A 04/27/2015   Procedure: COLONOSCOPY;  Surgeon: Cassie Click, MD;  Location: [facility] ENDOSCOPY;  Service: Endoscopy;  Laterality: N/A;   PLANTAR FASCIA SURGERY Right 12/08/2010   plantar fascitis     TUBAL LIGATION  01/18/86   Patient Active Problem List   Diagnosis Date Noted   Bee sting reaction 07/03/2020   Arthritis of carpometacarpal [facility]) joint of right thumb 01/24/2020   Bilateral hip pain 01/24/2020   Acute pain of left shoulder 01/20/2019   Acute pain of right knee 01/20/2019   Hypothyroidism 01/15/2018   ACNE ROSACEA 11/19/2010   HYPERCHOLESTEROLEMIA 05/16/2010   Essential hypertension, benign 02/04/2010    PCP:   Judithann Novas, MD    REFERRING PROVIDER:   Judithann Novas, MD    REFERRING DIAG: [phone] (ICD-10-CM) - Bilateral hip pain   THERAPY DIAG:  Pain in right hip  Pain in left hip  Stiffness of right hip, not elsewhere classified  Rationale for Evaluation and Treatment: Rehabilitation  ONSET DATE: 10/2023  SUBJECTIVE:   SUBJECTIVE STATEMENT: Patient reports 3-4/10 pain that is  worsening now, is constant now.  Eval:  Last fall pt began having pain in her right hip that descended into her right thigh. Pt MD thought it was bursitis and was prescribed prednisone  ( not effective) and exercises ( not completed per report). Pt reports rest is helpful but she likes to stay active. Pt likes to play pickle-ball and walk for exercise ( can't do at the moment due to her knee per her MD).   PERTINENT HISTORY: Pmx of GERD, HLD, HTN PAIN:  Are you having pain? Yes: NPRS scale: 4/10 with movement. Pain location: B hips anterior, but greater on the R hip posterior Pain description: heaviness, awkwardness  Aggravating factors: increased activity Relieving factors: rest   PRECAUTIONS: None  RED FLAGS: None   WEIGHT BEARING RESTRICTIONS: No  FALLS:  Has patient fallen in last 6 months? No  LIVING ENVIRONMENT: Lives with: lives with their spouse Lives in: House/apartment Stairs: Yes: External: 16 steps; on right going up Has following equipment at home: None  OCCUPATION: Retired Engineer, site, working part time at [employer]   PLOF: Independent  PATIENT GOALS: To improve pain and improve activity tolerance   NEXT MD VISIT: No follow up scheduled   OBJECTIVE:  Note: Objective measures were completed at Evaluation unless otherwise noted.  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    PATIENT SURVEYS:  LEFS 34  COGNITION: Overall cognitive status: Within functional limits for tasks assessed     SENSATION: Not tested  EDEMA:  None present   MUSCLE LENGTH: Hamstrings: Right 35 deg; WNL Thomas test:Post R>L for quad tightness     PALPATION: Assess next visit if indicated   LOWER EXTREMITY OZD:GUYQIHK Hip IR and ER in future session  P ROM Right eval Left eval  Hip flexion WNL WNL  Hip extension    Hip abduction    Hip adduction    Hip internal rotation    Hip  external rotation    Knee flexion WNL WNL  Knee extension WNL WNL  Ankle dorsiflexion    Ankle plantarflexion    Ankle inversion    Ankle eversion     (Blank rows = not tested)  LOWER EXTREMITY MMT: -used active force and took pek muscle force and listed below # = lb  MMT Right eval Left eval  Hip flexion -supine 12.6#* 25#  Hip extension- prone 17.5# 21.3#  Hip abduction - SL 14.7# 22#  Hip adduction    Hip internal rotation    Hip external rotation    Knee flexion    Knee extension 26.2 #* 29#  Ankle dorsiflexion 29.4# 29.3#  Ankle plantarflexion    Ankle inversion    Ankle eversion     (Blank rows = not tested)  LOWER EXTREMITY SPECIAL TESTS:  Hip special tests: Portia Brittle (FABER) test: positive  and Thomas test: positive  Knee special tests: none this date, pt has reported R meniscal tear and has bad prior PT for this   FUNCTIONAL TESTS:  5 times sit to stand: 14.6 sec   GAIT: Distance walked: 30 ft Assistive device utilized: None Level of assistance: Complete Independence Comments: Hip ER, slight tredelenberg hip drop                                                                                                                                TREATMENT DATE: 04/12/24  supine HS stretch 30 seconds x 2 bil  Supine figure four stretch 40 seconds each LE Sciatic nerve glide 30 x RLE SAD in figure four position 3x30 seconds  Pelvic rock x 12  TrA activation with march 10x each LE Tra activation with adduction ball squeeze 10x Bridge with hip abduction x 12  Bridge 10x with adduction squeeze BTB abduction 15x  Prone hamstring curl 10x each LE Prone hip flexor lengthening stretch 60 seconds  Seated: RTB abduction step with band around ankles 12x each side  Manual:  Grade 2 UPA to the L paraspinals L1-L4 x 30 sec per segment.    Trigger Point Dry Needling  Subsequent Treatment: Instructions provided previously at initial dry needling treatment.   Patient  Verbal Consent Given: Yes Education Handout Provided: Previously Provided Muscles Treated: GLute max/glute med  Electrical Stimulation Performed: No Treatment Response/Outcome: twictch in glute max.      PATIENT EDUCATION: Education details: POC. Pt educated throughout session about proper posture and technique with exercises. Improved exercise technique, movement at target joints, use of target muscles after min to mod verbal, visual, tactile cues. - Importance of warm up and cool down with Physical activity to reduce residual soreness and pain/injury.   Person educated: Patient Education method: Explanation Education comprehension: verbalized understanding   HOME EXERCISE PROGRAM: Provided 4/24.    ASSESSMENT:  CLINICAL IMPRESSION: Patient has decreased pain with SAD in figure four position. Core stabilization tolerated well with cueing for dual actions and breathing. Patient does have some radiating pain with non involved side LE movement indicating need for continued core stabilization training.  Patient will benefit from skilled physical therapy to improve her hip strength, improve her hip mobility, improve her hip pain, and improve her overall quality of life.  OBJECTIVE IMPAIRMENTS: Abnormal gait, decreased activity tolerance, decreased mobility, decreased strength, and pain.   ACTIVITY LIMITATIONS: squatting and locomotion level  PARTICIPATION LIMITATIONS: community activity and yard work  PERSONAL FACTORS: 3+ comorbidities: GERD, HLD, HTN  are also affecting patient's functional outcome.   REHAB POTENTIAL: Good  CLINICAL DECISION MAKING: Evolving/moderate complexity  EVALUATION COMPLEXITY: Moderate   GOALS: Goals reviewed with patient? Yes  SHORT TERM GOALS: Target date: 04/20/2024     Patient will be independent in home exercise program to improve strength/mobility for better functional independence with ADLs. Baseline: No HEP currently  Goal status:  INITIAL    LONG TERM GOALS: Target date: 05/18/2024   1.  Patient  will complete five times sit to stand test in < 11 seconds indicating an increased LE strength and improved balance. Baseline: 14.6 s Goal status: INITIAL  2.  Patient will improve LEFS score to 44   to demonstrate statistically significant improvement in mobility and quality of life as it relates to their LE pain.  Baseline: 34 Goal status: INITIAL    3.  Patient will improve left and right hip abduction and flexion strength by 5 pounds of force or greater using active force stools in order to indicate improved lower extremity strength for activities such as pickleball. Baseline: see eval cahrt  Goal status: INITIAL  4.  Patient report moderate difficulty or less difficulty with putting on her socks and shoes in order to indicate improved hip mobility for functional activities.  Baseline: quite a bit of difficulty  Goal status: INITIAL     PLAN:  PT FREQUENCY: 2x/week  PT DURATION: 8 weeks  PLANNED INTERVENTIONS: 97750- Physical Performance Testing, 97110-Therapeutic exercises, 97530- Therapeutic activity, 97112- Neuromuscular re-education, 97535- Self Care, 82956- Manual therapy, [phone]- Gait training, Patient/Family education, Stair training, Taping, Dry Needling, Joint mobilization, Spinal mobilization, Cryotherapy, and Moist heat  PLAN FOR NEXT SESSION:   Hip mobility continue strengthening interventions  Lumbar stability.    Addilyne Backs, PT 04/12/2024, 11:02 AM

## 2024-04-13 ENCOUNTER — Other Ambulatory Visit: Payer: Self-pay | Admitting: Family Medicine

## 2024-04-13 MED ORDER — TRAZODONE HCL 50 MG PO TABS: 25.0000 mg | ORAL_TABLET | Freq: Every evening | ORAL | 3 refills | Status: DC | PRN

## 2024-04-14 ENCOUNTER — Ambulatory Visit: Admitting: Physical Therapy

## 2024-04-14 DIAGNOSIS — M6281 Muscle weakness (generalized): Secondary | ICD-10-CM

## 2024-04-14 DIAGNOSIS — M25551 Pain in right hip: Secondary | ICD-10-CM

## 2024-04-14 DIAGNOSIS — M25651 Stiffness of right hip, not elsewhere classified: Secondary | ICD-10-CM

## 2024-04-14 DIAGNOSIS — M25552 Pain in left hip: Secondary | ICD-10-CM

## 2024-04-14 NOTE — Therapy (Signed)
 OUTPATIENT PHYSICAL THERAPY LOWER EXTREMITY TREATMENT   Patient Name: Summer Hernandez MRN: [id number] DOB:1961/05/28, 63 y.o., female Today's Date: 04/14/2024  END OF SESSION:  PT End of Session - 04/14/24 1314     Visit Number 7    Number of Visits 16    Date for PT Re-Evaluation 05/18/24    Progress Note Due on Visit 10    PT Start Time 1145    PT Stop Time 1229    PT Time Calculation (min) 44 min    Activity Tolerance Patient tolerated treatment well    Behavior During Therapy [facility] for tasks assessed/performed               Past Medical History:  Diagnosis Date   GERD (gastroesophageal reflux disease)    Hyperlipidemia    Hypertension    Shortness of breath dyspnea    Thyroid  disease    Past Surgical History:  Procedure Laterality Date   CESAREAN SECTION     CHOLECYSTECTOMY  12/29/06   CHOLECYSTECTOMY, LAPAROSCOPIC     COLONOSCOPY N/A 04/27/2015   Procedure: COLONOSCOPY;  Surgeon: Cassie Click, MD;  Location: [facility] ENDOSCOPY;  Service: Endoscopy;  Laterality: N/A;   PLANTAR FASCIA SURGERY Right 12/08/2010   plantar fascitis     TUBAL LIGATION  01/18/86   Patient Active Problem List   Diagnosis Date Noted   Bee sting reaction 07/03/2020   Arthritis of carpometacarpal [facility]) joint of right thumb 01/24/2020   Bilateral hip pain 01/24/2020   Acute pain of left shoulder 01/20/2019   Acute pain of right knee 01/20/2019   Hypothyroidism 01/15/2018   ACNE ROSACEA 11/19/2010   HYPERCHOLESTEROLEMIA 05/16/2010   Essential hypertension, benign 02/04/2010    PCP:   Judithann Novas, MD    REFERRING PROVIDER:   Judithann Novas, MD    REFERRING DIAG: [phone] (ICD-10-CM) - Bilateral hip pain   THERAPY DIAG:  Pain in right hip  Pain in left hip  Stiffness of right hip, not elsewhere classified  Muscle weakness (generalized)  Rationale for Evaluation and Treatment: Rehabilitation  ONSET DATE: 10/2023  SUBJECTIVE:   SUBJECTIVE  STATEMENT: Patient reports 3-4/10 pain that is consistent today.  Eval:  Last fall pt began having pain in her right hip that descended into her right thigh. Pt MD thought it was bursitis and was prescribed prednisone  ( not effective) and exercises ( not completed per report). Pt reports rest is helpful but she likes to stay active. Pt likes to play pickle-ball and walk for exercise ( can't do at the moment due to her knee per her MD).   PERTINENT HISTORY: Pmx of GERD, HLD, HTN PAIN:  Are you having pain? Yes: NPRS scale: 3/10 with movement. Pain location: B hips anterior, but greater on the R hip posterior Pain description: heaviness, awkwardness  Aggravating factors: increased activity Relieving factors: rest   PRECAUTIONS: None  RED FLAGS: None   WEIGHT BEARING RESTRICTIONS: No  FALLS:  Has patient fallen in last 6 months? No  LIVING ENVIRONMENT: Lives with: lives with their spouse Lives in: House/apartment Stairs: Yes: External: 16 steps; on right going up Has following equipment at home: None  OCCUPATION: Retired Engineer, site, working part time at [employer]   PLOF: Independent  PATIENT GOALS: To improve pain and improve activity tolerance   NEXT MD VISIT: No follow up scheduled   OBJECTIVE:  Note: Objective measures were completed at Evaluation unless otherwise noted.  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    PATIENT SURVEYS:  LEFS 34  COGNITION: Overall cognitive status: Within functional limits for tasks assessed     SENSATION: Not tested  EDEMA:  None present   MUSCLE LENGTH: Hamstrings: Right 35 deg; WNL Thomas test:Post R>L for quad tightness     PALPATION: Assess next visit if indicated   LOWER EXTREMITY WUJ:[id number] Hip IR and ER in future session  P ROM Right eval Left eval  Hip flexion WNL WNL  Hip extension    Hip abduction    Hip adduction     Hip internal rotation    Hip external rotation    Knee flexion WNL WNL  Knee extension WNL WNL  Ankle dorsiflexion    Ankle plantarflexion    Ankle inversion    Ankle eversion     (Blank rows = not tested)  LOWER EXTREMITY MMT: -used active force and took pek muscle force and listed below # = lb  MMT Right eval Left eval  Hip flexion -supine 12.6#* 25#  Hip extension- prone 17.5# 21.3#  Hip abduction - SL 14.7# 22#  Hip adduction    Hip internal rotation    Hip external rotation    Knee flexion    Knee extension 26.2 #* 29#  Ankle dorsiflexion 29.4# 29.3#  Ankle plantarflexion    Ankle inversion    Ankle eversion     (Blank rows = not tested)  LOWER EXTREMITY SPECIAL TESTS:  Hip special tests: Portia Brittle (FABER) test: positive  and Thomas test: positive  Knee special tests: none this date, pt has reported R meniscal tear and has bad prior PT for this   FUNCTIONAL TESTS:  5 times sit to stand: 14.6 sec   GAIT: Distance walked: 30 ft Assistive device utilized: None Level of assistance: Complete Independence Comments: Hip ER, slight tredelenberg hip drop                                                                                                                                TREATMENT DATE: 04/14/24   NMR/TE TrA activation 2 x 10 x 5 sec, cues for min pelvis movement to keep natural lordotic curve in back present  SAD in figure four position 3x30 seconds  Mcgill crunch x 10 ea LE with no hold time, cues for cervical comfort/ limiting cervical strain.   Hip 3 way bilateral  x 10 ea Bilateral ( hip flex, abd, extension In supine, prone and SL respectively) Bridge 10x with adduction squeeze Bridge on therapy ball x 10 for hamstring activation SL modified side plank 2 x 15 sec  Bird dog LE only x 10 ea LE with 3 sec hold on last 3 reps  Seated: RTB abduction step with band around ankles 12x each side  PATIENT EDUCATION: Education details: POC. Pt educated  throughout session about proper posture and technique with exercises. Improved  exercise technique, movement at target joints, use of target muscles after min to mod verbal, visual, tactile cues. - Importance of warm up and cool down with Physical activity to reduce residual soreness and pain/injury.   Person educated: Patient Education method: Explanation Education comprehension: verbalized understanding   HOME EXERCISE PROGRAM:  Access Code: [email or web address] URL: [email or web address] Date: 04/14/2024 Prepared by: Marlynn Singer  Exercises - Active Straight Leg Raise with Quad Set  - 1 x daily - 5 x weekly - 2 sets - 10 reps - Prone Hip Extension  - 1 x daily - 5 x weekly - 2 sets - 10 reps - Sidelying Hip Abduction  - 1 x daily - 5 x weekly - 2 sets - 10 reps - Supine Bridge  - 1 x daily - 5 x weekly - 1 sets - 10 reps - Bridge with Heels on [employer]  - 1 x daily - 5 x weekly - 1 sets - 10 reps - Side Plank on Knees  - 1 x daily - 5 x weekly - 2 sets - 15 sec hold - Beginner Front Arm Support  - 1 x daily - 7 x weekly - 1 sets - 10 reps - 3 sec  hold   ASSESSMENT:  CLINICAL IMPRESSION: Patient arrived with good motivation for completion of pt activities.  Pt challenged with further hip strengthening and core stabilization activities. Pt provided with progressed HEP reflecting some of the advanced exercises. Pt reported no increase in pain and some improvement in symptoms following therapy this date. Pt will continue to benefit from skilled physical therapy intervention to address impairments, improve QOL, and attain therapy goals.    OBJECTIVE IMPAIRMENTS: Abnormal gait, decreased activity tolerance, decreased mobility, decreased strength, and pain.   ACTIVITY LIMITATIONS: squatting and locomotion level  PARTICIPATION LIMITATIONS: community activity and yard work  PERSONAL FACTORS: 3+ comorbidities: GERD, HLD, HTN are also affecting patient's functional outcome.    REHAB POTENTIAL: Good  CLINICAL DECISION MAKING: Evolving/moderate complexity  EVALUATION COMPLEXITY: Moderate   GOALS: Goals reviewed with patient? Yes  SHORT TERM GOALS: Target date: 04/20/2024     Patient will be independent in home exercise program to improve strength/mobility for better functional independence with ADLs. Baseline: No HEP currently  Goal status: INITIAL    LONG TERM GOALS: Target date: 05/18/2024   1.  Patient  will complete five times sit to stand test in < 11 seconds indicating an increased LE strength and improved balance. Baseline: 14.6 s Goal status: INITIAL  2.  Patient will improve LEFS score to 44   to demonstrate statistically significant improvement in mobility and quality of life as it relates to their LE pain.  Baseline: 34 Goal status: INITIAL    3.  Patient will improve left and right hip abduction and flexion strength by 5 pounds of force or greater using active force stools in order to indicate improved lower extremity strength for activities such as pickleball. Baseline: see eval cahrt  Goal status: INITIAL  4.  Patient report moderate difficulty or less difficulty with putting on her socks and shoes in order to indicate improved hip mobility for functional activities.  Baseline: quite a bit of difficulty  Goal status: INITIAL     PLAN:  PT FREQUENCY: 2x/week  PT DURATION: 8 weeks  PLANNED INTERVENTIONS: 97750- Physical Performance Testing, 97110-Therapeutic exercises, 97530- Therapeutic activity, [id number]- Neuromuscular re-education, 97535- Self Care, [id number]- Manual therapy, [phone]- Gait training, Patient/Family education, Stair training, Taping, Dry Needling, Joint  mobilization, Spinal mobilization, Cryotherapy, and Moist heat  PLAN FOR NEXT SESSION:   Hip mobility continue strengthening interventions  Lumbar/core stability.    Edwina Gram, PT 04/14/2024, 1:15 PM

## 2024-04-20 ENCOUNTER — Ambulatory Visit: Admitting: Physical Therapy

## 2024-04-20 DIAGNOSIS — M25552 Pain in left hip: Secondary | ICD-10-CM

## 2024-04-20 DIAGNOSIS — M25551 Pain in right hip: Secondary | ICD-10-CM | POA: Diagnosis not present

## 2024-04-20 DIAGNOSIS — M6281 Muscle weakness (generalized): Secondary | ICD-10-CM

## 2024-04-20 DIAGNOSIS — M25651 Stiffness of right hip, not elsewhere classified: Secondary | ICD-10-CM

## 2024-04-20 NOTE — Therapy (Signed)
 OUTPATIENT PHYSICAL THERAPY LOWER EXTREMITY TREATMENT   Patient Name: Summer Hernandez MRN: [id number] DOB:July 05, 1961, 63 y.o., female Today's Date: 04/20/2024  END OF SESSION:  PT End of Session - 04/20/24 0801     Visit Number 8    Number of Visits 16    Date for PT Re-Evaluation 05/18/24    Progress Note Due on Visit 10    PT Start Time 0802    PT Stop Time 0842    PT Time Calculation (min) 40 min    Activity Tolerance Patient tolerated treatment well    Behavior During Therapy [facility] for tasks assessed/performed               Past Medical History:  Diagnosis Date   GERD (gastroesophageal reflux disease)    Hyperlipidemia    Hypertension    Shortness of breath dyspnea    Thyroid  disease    Past Surgical History:  Procedure Laterality Date   CESAREAN SECTION     CHOLECYSTECTOMY  12/29/06   CHOLECYSTECTOMY, LAPAROSCOPIC     COLONOSCOPY N/A 04/27/2015   Procedure: COLONOSCOPY;  Surgeon: Cassie Click, MD;  Location: [facility] ENDOSCOPY;  Service: Endoscopy;  Laterality: N/A;   PLANTAR FASCIA SURGERY Right 12/08/2010   plantar fascitis     TUBAL LIGATION  01/18/86   Patient Active Problem List   Diagnosis Date Noted   Bee sting reaction 07/03/2020   Arthritis of carpometacarpal [facility]) joint of right thumb 01/24/2020   Bilateral hip pain 01/24/2020   Acute pain of left shoulder 01/20/2019   Acute pain of right knee 01/20/2019   Hypothyroidism 01/15/2018   ACNE ROSACEA 11/19/2010   HYPERCHOLESTEROLEMIA 05/16/2010   Essential hypertension, benign 02/04/2010    PCP:   Judithann Novas, MD    REFERRING PROVIDER:   Judithann Novas, MD    REFERRING DIAG: [phone] (ICD-10-CM) - Bilateral hip pain   THERAPY DIAG:  Pain in right hip  Pain in left hip  Stiffness of right hip, not elsewhere classified  Muscle weakness (generalized)  Rationale for Evaluation and Treatment: Rehabilitation  ONSET DATE: 10/2023  SUBJECTIVE:   SUBJECTIVE  STATEMENT: Patient reports 2/10 discomfort in her hip and a 4/10 numbness sensation   Eval:  Last fall pt began having pain in her right hip that descended into her right thigh. Pt MD thought it was bursitis and was prescribed prednisone  ( not effective) and exercises ( not completed per report). Pt reports rest is helpful but she likes to stay active. Pt likes to play pickle-ball and walk for exercise ( can't do at the moment due to her knee per her MD).   PERTINENT HISTORY: Pmx of GERD, HLD, HTN PAIN:  Are you having pain? Yes: NPRS scale: 3/10 with movement. Pain location: B hips anterior, but greater on the R hip posterior Pain description: heaviness, awkwardness  Aggravating factors: increased activity Relieving factors: rest   PRECAUTIONS: None  RED FLAGS: None   WEIGHT BEARING RESTRICTIONS: No  FALLS:  Has patient fallen in last 6 months? No  LIVING ENVIRONMENT: Lives with: lives with their spouse Lives in: House/apartment Stairs: Yes: External: 16 steps; on right going up Has following equipment at home: None  OCCUPATION: Retired Engineer, site, working part time at [employer]   PLOF: Independent  PATIENT GOALS: To improve pain and improve activity tolerance   NEXT MD VISIT: No follow up scheduled   OBJECTIVE:  Note: Objective measures were completed at Evaluation unless otherwise noted.  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    PATIENT SURVEYS:  LEFS 34  COGNITION: Overall cognitive status: Within functional limits for tasks assessed     SENSATION: Not tested  EDEMA:  None present   MUSCLE LENGTH: Hamstrings: Right 35 deg; WNL Thomas test:Post R>L for quad tightness     PALPATION: Assess next visit if indicated   LOWER EXTREMITY BJY:NWGNFAO Hip IR and ER in future session  P ROM Right eval Left eval  Hip flexion WNL WNL  Hip extension    Hip abduction     Hip adduction    Hip internal rotation    Hip external rotation    Knee flexion WNL WNL  Knee extension WNL WNL  Ankle dorsiflexion    Ankle plantarflexion    Ankle inversion    Ankle eversion     (Blank rows = not tested)  LOWER EXTREMITY MMT: -used active force and took pek muscle force and listed below # = lb  MMT Right eval Left eval  Hip flexion -supine 12.6#* 25#  Hip extension- prone 17.5# 21.3#  Hip abduction - SL 14.7# 22#  Hip adduction    Hip internal rotation    Hip external rotation    Knee flexion    Knee extension 26.2 #* 29#  Ankle dorsiflexion 29.4# 29.3#  Ankle plantarflexion    Ankle inversion    Ankle eversion     (Blank rows = not tested)  LOWER EXTREMITY SPECIAL TESTS:  Hip special tests: Portia Brittle (FABER) test: positive  and Thomas test: positive  Knee special tests: none this date, pt has reported R meniscal tear and has bad prior PT for this   FUNCTIONAL TESTS:  5 times sit to stand: 14.6 sec   GAIT: Distance walked: 30 ft Assistive device utilized: None Level of assistance: Complete Independence Comments: Hip ER, slight tredelenberg hip drop                                                                                                                                TREATMENT DATE: 04/20/24   NMR/TE TrA activation 3 x 10 x 5 sec, cues for min pelvis movement to keep natural lordotic curve in back present  Mcgill crunch x 10 ea LE with no hold time, cues for cervical comfort/ limiting cervical strain.   -added chin tuck exercise to assist pt to maintain neutral cervical curvature throughout movement Hip 3 way bilateral  x 12 ea Bilateral ( hip flex, abd, extension In supine, prone and SL respectively) Bridge 2 x 10 with march for hip stabilization at top HS rolls on Pball x 20  SL modified side plank on knees 3 x 15 sec  Bird dog 2 x 5 ea UE/LE   PATIENT EDUCATION: Education details: POC. Pt educated throughout session about proper  posture and technique with exercises. Improved exercise technique, movement at  target joints, use of target muscles after min to mod verbal, visual, tactile cues. - Importance of warm up and cool down with Physical activity to reduce residual soreness and pain/injury.   Person educated: Patient Education method: Explanation Education comprehension: verbalized understanding   HOME EXERCISE PROGRAM:  Access Code: [email or web address] URL: [email or web address] Date: 04/14/2024 Prepared by: Marlynn Singer  Exercises - Active Straight Leg Raise with Quad Set  - 1 x daily - 5 x weekly - 2 sets - 10 reps - Prone Hip Extension  - 1 x daily - 5 x weekly - 2 sets - 10 reps - Sidelying Hip Abduction  - 1 x daily - 5 x weekly - 2 sets - 10 reps - Supine Bridge  - 1 x daily - 5 x weekly - 1 sets - 10 reps - Bridge with Heels on [employer]  - 1 x daily - 5 x weekly - 1 sets - 10 reps - Side Plank on Knees  - 1 x daily - 5 x weekly - 2 sets - 15 sec hold - Beginner Front Arm Support  - 1 x daily - 7 x weekly - 1 sets - 10 reps - 3 sec  hold   ASSESSMENT:  CLINICAL IMPRESSION: Patient arrived with good motivation for completion of pt activities.  Pt challenged with further hip strengthening and core stabilization activities.Advanced bird dog and side plank exercise for HEP. Pt has pickle ball game tonight ( last of season) and is hopeful this will not be exacerbating. Pt will continue to benefit from skilled physical therapy intervention to address impairments, improve QOL, and attain therapy goals.    OBJECTIVE IMPAIRMENTS: Abnormal gait, decreased activity tolerance, decreased mobility, decreased strength, and pain.   ACTIVITY LIMITATIONS: squatting and locomotion level  PARTICIPATION LIMITATIONS: community activity and yard work  PERSONAL FACTORS: 3+ comorbidities: GERD, HLD, HTN are also affecting patient's functional outcome.   REHAB POTENTIAL: Good  CLINICAL DECISION MAKING:  Evolving/moderate complexity  EVALUATION COMPLEXITY: Moderate   GOALS: Goals reviewed with patient? Yes  SHORT TERM GOALS: Target date: 04/20/2024     Patient will be independent in home exercise program to improve strength/mobility for better functional independence with ADLs. Baseline: No HEP currently  Goal status: INITIAL    LONG TERM GOALS: Target date: 05/18/2024   1.  Patient  will complete five times sit to stand test in < 11 seconds indicating an increased LE strength and improved balance. Baseline: 14.6 s Goal status: INITIAL  2.  Patient will improve LEFS score to 44   to demonstrate statistically significant improvement in mobility and quality of life as it relates to their LE pain.  Baseline: 34 Goal status: INITIAL    3.  Patient will improve left and right hip abduction and flexion strength by 5 pounds of force or greater using active force stools in order to indicate improved lower extremity strength for activities such as pickleball. Baseline: see eval cahrt  Goal status: INITIAL  4.  Patient report moderate difficulty or less difficulty with putting on her socks and shoes in order to indicate improved hip mobility for functional activities.  Baseline: quite a bit of difficulty  Goal status: INITIAL     PLAN:  PT FREQUENCY: 2x/week  PT DURATION: 8 weeks  PLANNED INTERVENTIONS: 97750- Physical Performance Testing, 97110-Therapeutic exercises, 97530- Therapeutic activity, 97112- Neuromuscular re-education, 97535- Self Care, 10272- Manual therapy, [phone]- Gait training, Patient/Family education, Stair training, Taping, Dry Needling, Joint mobilization, Spinal mobilization, Cryotherapy,  and Moist heat  PLAN FOR NEXT SESSION:   Hip mobility continue strengthening interventions  Lumbar/core stability.    Edwina Gram, PT 04/20/2024, 8:02 AM

## 2024-04-22 ENCOUNTER — Ambulatory Visit: Admitting: Physical Therapy

## 2024-04-25 NOTE — Therapy (Signed)
 OUTPATIENT PHYSICAL THERAPY LOWER EXTREMITY TREATMENT   Patient Name: Summer Hernandez MRN: [id number] DOB:[birth year], 62 y.o., female Today's Date: 04/26/2024  END OF SESSION:  PT End of Session - 04/26/24 0845     Visit Number 9    Number of Visits 16    Date for PT Re-Evaluation 05/18/24    Progress Note Due on Visit 10    PT Start Time 0845    PT Stop Time 0928    PT Time Calculation (min) 43 min    Activity Tolerance Patient tolerated treatment well    Behavior During Therapy [facility] for tasks assessed/performed                Past Medical History:  Diagnosis Date   GERD (gastroesophageal reflux disease)    Hyperlipidemia    Hypertension    Shortness of breath dyspnea    Thyroid  disease    Past Surgical History:  Procedure Laterality Date   CESAREAN SECTION     CHOLECYSTECTOMY  12/29/06   CHOLECYSTECTOMY, LAPAROSCOPIC     COLONOSCOPY N/A 04/27/2015   Procedure: COLONOSCOPY;  Surgeon: Cassie Click, MD;  Location: [facility] ENDOSCOPY;  Service: Endoscopy;  Laterality: N/A;   PLANTAR FASCIA SURGERY Right 12/08/2010   plantar fascitis     TUBAL LIGATION  01/18/86   Patient Active Problem List   Diagnosis Date Noted   Bee sting reaction 07/03/2020   Arthritis of carpometacarpal [facility]) joint of right thumb 01/24/2020   Bilateral hip pain 01/24/2020   Acute pain of left shoulder 01/20/2019   Acute pain of right knee 01/20/2019   Hypothyroidism 01/15/2018   ACNE ROSACEA 11/19/2010   HYPERCHOLESTEROLEMIA 05/16/2010   Essential hypertension, benign 02/04/2010    PCP:   Judithann Novas, MD    REFERRING PROVIDER:   Judithann Novas, MD    REFERRING DIAG: [phone] (ICD-10-CM) - Bilateral hip pain   THERAPY DIAG:  Pain in right hip  Pain in left hip  Muscle weakness (generalized)  Stiffness of right hip, not elsewhere classified  Rationale for Evaluation and Treatment: Rehabilitation  ONSET DATE: 10/2023  SUBJECTIVE:   SUBJECTIVE  STATEMENT: Patient leaves for [place]  in two weeks. Still has the numbness in her thigh.   Eval:  Last fall pt began having pain in her right hip that descended into her right thigh. Pt MD thought it was bursitis and was prescribed prednisone  ( not effective) and exercises ( not completed per report). Pt reports rest is helpful but she likes to stay active. Pt likes to play pickle-ball and walk for exercise ( can't do at the moment due to her knee per her MD).   PERTINENT HISTORY: Pmx of GERD, HLD, HTN PAIN:  Are you having pain? Yes: NPRS scale: 3/10 with movement. Pain location: B hips anterior, but greater on the R hip posterior Pain description: heaviness, awkwardness  Aggravating factors: increased activity Relieving factors: rest   PRECAUTIONS: None  RED FLAGS: None   WEIGHT BEARING RESTRICTIONS: No  FALLS:  Has patient fallen in last 6 months? No  LIVING ENVIRONMENT: Lives with: lives with their spouse Lives in: House/apartment Stairs: Yes: External: 16 steps; on right going up Has following equipment at home: None  OCCUPATION: Retired Engineer, site, working part time at [employer]   PLOF: Independent  PATIENT GOALS: To improve pain and improve activity tolerance   NEXT MD VISIT: No follow up scheduled   OBJECTIVE:  Note: Objective measures were completed at Evaluation  unless otherwise noted.  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    PATIENT SURVEYS:  LEFS 34  COGNITION: Overall cognitive status: Within functional limits for tasks assessed     SENSATION: Not tested  EDEMA:  None present   MUSCLE LENGTH: Hamstrings: Right 35 deg; WNL Thomas test:Post R>L for quad tightness     PALPATION: Assess next visit if indicated   LOWER EXTREMITY ZOX:WRUEAVW Hip IR and ER in future session  P ROM Right eval Left eval  Hip flexion WNL WNL  Hip extension    Hip  abduction    Hip adduction    Hip internal rotation    Hip external rotation    Knee flexion WNL WNL  Knee extension WNL WNL  Ankle dorsiflexion    Ankle plantarflexion    Ankle inversion    Ankle eversion     (Blank rows = not tested)  LOWER EXTREMITY MMT: -used active force and took pek muscle force and listed below # = lb  MMT Right eval Left eval  Hip flexion -supine 12.6#* 25#  Hip extension- prone 17.5# 21.3#  Hip abduction - SL 14.7# 22#  Hip adduction    Hip internal rotation    Hip external rotation    Knee flexion    Knee extension 26.2 #* 29#  Ankle dorsiflexion 29.4# 29.3#  Ankle plantarflexion    Ankle inversion    Ankle eversion     (Blank rows = not tested)  LOWER EXTREMITY SPECIAL TESTS:  Hip special tests: Portia Brittle (FABER) test: positive  and Thomas test: positive  Knee special tests: none this date, pt has reported R meniscal tear and has bad prior PT for this   FUNCTIONAL TESTS:  5 times sit to stand: 14.6 sec   GAIT: Distance walked: 30 ft Assistive device utilized: None Level of assistance: Complete Independence Comments: Hip ER, slight tredelenberg hip drop                                                                                                                                TREATMENT DATE: 04/26/24   supine HS stretch 30 seconds x 2 bil  Supine figure four stretch 40 seconds each LE Sciatic nerve glide 30 x RLE Pelvic rock x 12  TrA activation with march 10x each LE Tra activation with adduction ball squeeze 10x Bridge with hip abduction x 12  Bridge 10x with adduction squeeze BTB abduction 15x Scapular retraction into table 15x   Prone hamstring curl 10x each LE Prone hip flexor lengthening stretch 60 seconds   Seated: RTB abduction step with band around ankles 12x each side RTB march with band across feet 12   Manual:  Grade 2 UPA to the L paraspinals L1-L4 x 30 sec per segment.    Trigger Point Dry Needling    Subsequent Treatment: Instructions provided previously at initial dry  needling treatment.    Patient Verbal Consent Given: Yes Education Handout Provided: Previously Provided Muscles Treated: GLute max/glute med Electrical Stimulation Performed: No Treatment Response/Outcome: twictch in glute max.     PATIENT EDUCATION: Education details: POC. Pt educated throughout session about proper posture and technique with exercises. Improved exercise technique, movement at target joints, use of target muscles after min to mod verbal, visual, tactile cues. - Importance of warm up and cool down with Physical activity to reduce residual soreness and pain/injury.   Person educated: Patient Education method: Explanation Education comprehension: verbalized understanding   HOME EXERCISE PROGRAM:  Access Code: [email or web address] URL: [email or web address] Date: 04/14/2024 Prepared by: Marlynn Singer  Exercises - Active Straight Leg Raise with Quad Set  - 1 x daily - 5 x weekly - 2 sets - 10 reps - Prone Hip Extension  - 1 x daily - 5 x weekly - 2 sets - 10 reps - Sidelying Hip Abduction  - 1 x daily - 5 x weekly - 2 sets - 10 reps - Supine Bridge  - 1 x daily - 5 x weekly - 1 sets - 10 reps - Bridge with Heels on [employer]  - 1 x daily - 5 x weekly - 1 sets - 10 reps - Side Plank on Knees  - 1 x daily - 5 x weekly - 2 sets - 15 sec hold - Beginner Front Arm Support  - 1 x daily - 7 x weekly - 1 sets - 10 reps - 3 sec  hold   ASSESSMENT:  CLINICAL IMPRESSION: Patient has large trigger point released in glute that allows for progression of glute and LE stabilization and strength exercises. Core exercises requires cueing for breathing due to patient preference to hold breath.  Pt will continue to benefit from skilled physical therapy intervention to address impairments, improve QOL, and attain therapy goals.    OBJECTIVE IMPAIRMENTS: Abnormal gait, decreased activity tolerance,  decreased mobility, decreased strength, and pain.   ACTIVITY LIMITATIONS: squatting and locomotion level  PARTICIPATION LIMITATIONS: community activity and yard work  PERSONAL FACTORS: 3+ comorbidities: GERD, HLD, HTN are also affecting patient's functional outcome.   REHAB POTENTIAL: Good  CLINICAL DECISION MAKING: Evolving/moderate complexity  EVALUATION COMPLEXITY: Moderate   GOALS: Goals reviewed with patient? Yes  SHORT TERM GOALS: Target date: 04/20/2024     Patient will be independent in home exercise program to improve strength/mobility for better functional independence with ADLs. Baseline: No HEP currently  Goal status: INITIAL    LONG TERM GOALS: Target date: 05/18/2024   1.  Patient  will complete five times sit to stand test in < 11 seconds indicating an increased LE strength and improved balance. Baseline: 14.6 s Goal status: INITIAL  2.  Patient will improve LEFS score to 44   to demonstrate statistically significant improvement in mobility and quality of life as it relates to their LE pain.  Baseline: 34 Goal status: INITIAL    3.  Patient will improve left and right hip abduction and flexion strength by 5 pounds of force or greater using active force stools in order to indicate improved lower extremity strength for activities such as pickleball. Baseline: see eval cahrt  Goal status: INITIAL  4.  Patient report moderate difficulty or less difficulty with putting on her socks and shoes in order to indicate improved hip mobility for functional activities.  Baseline: quite a bit of difficulty  Goal status: INITIAL     PLAN:  PT FREQUENCY:  2x/week  PT DURATION: 8 weeks  PLANNED INTERVENTIONS: 97750- Physical Performance Testing, 97110-Therapeutic exercises, 97530- Therapeutic activity, 97112- Neuromuscular re-education, 97535- Self Care, 16109- Manual therapy, [phone]- Gait training, Patient/Family education, Stair training, Taping, Dry Needling,  Joint mobilization, Spinal mobilization, Cryotherapy, and Moist heat  PLAN FOR NEXT SESSION:   Hip mobility continue strengthening interventions  Lumbar/core stability.    Kyndra Condron, PT 04/26/2024, 9:29 AM

## 2024-04-26 ENCOUNTER — Ambulatory Visit: Payer: Self-pay

## 2024-04-26 ENCOUNTER — Ambulatory Visit

## 2024-04-26 DIAGNOSIS — M25552 Pain in left hip: Secondary | ICD-10-CM

## 2024-04-26 DIAGNOSIS — M6281 Muscle weakness (generalized): Secondary | ICD-10-CM

## 2024-04-26 DIAGNOSIS — M25551 Pain in right hip: Secondary | ICD-10-CM

## 2024-04-26 DIAGNOSIS — M25651 Stiffness of right hip, not elsewhere classified: Secondary | ICD-10-CM

## 2024-04-26 NOTE — Telephone Encounter (Signed)
 Noted

## 2024-04-26 NOTE — Telephone Encounter (Signed)
 Chief Complaint: thigh tingling Symptoms: tingling sensation Frequency: constant X 3 weeks Pertinent Negatives: Patient denies pain, signs of infections, swelling, shortness of breath, chest pain, all other symptoms not in additional notes.  Disposition: [] ED /[] Urgent Care (no appt availability in office) / [x] Appointment(In office/virtual)/ []  [place] [facility]/ [] Home Care/ [] Refused Recommended Disposition /[] [place] [facility]/ []  Follow-up with PCP Additional Notes:  Goes to PT regularly for back & hip pain. She mentioned thigh tingling about 3 weeks ago to PT and they advised she monitor and follow up with PCP if not improved in two weeks. Calling today to schedule evaluation with PCP.  Upper right thigh tingling for about 3 weeks. Denies pain. It had been intermittent mild pain that starts near groin and radiate to thigh but that has resolved. Has persistent constant tingling, occasional numbness in right upper thigh. Skin looks normal color, normal temperature, no redness or signs of infection. Acute evaluation scheduled with PCP 04/27/24. Educated on care advice as documented in protocol, patient verbalized understanding. Discussed reasons to call back.     Copied from CRM [id number]. Topic: Clinical - Red Word Triage >> Apr 26, 2024  9:44 AM Luane Rumps D wrote: Red Word that prompted transfer to Nurse Triage: Upper right thigh tingling & numbness Reason for Disposition  Numbness in a leg or foot (i.e., loss of sensation)  Protocols used: Leg Pain-A-AH

## 2024-04-27 ENCOUNTER — Ambulatory Visit: Admitting: Family Medicine

## 2024-04-27 ENCOUNTER — Encounter: Payer: Self-pay | Admitting: Family Medicine

## 2024-04-27 VITALS — BP 124/72 | HR 78 | Temp 98.4°F | Ht 62.5 in | Wt 184.8 lb

## 2024-04-27 DIAGNOSIS — R200 Anesthesia of skin: Secondary | ICD-10-CM | POA: Insufficient documentation

## 2024-04-27 MED ORDER — PREDNISONE 20 MG PO TABS: ORAL_TABLET | ORAL | 0 refills | Status: DC

## 2024-04-27 NOTE — Assessment & Plan Note (Signed)
 Acute, numbness in L2 distribution began after patient started physical therapy for bilateral hip pain.  Hip pain is improved but she does have some tenderness reported although not on exam in right sciatic notch. Numbness in right medial thigh and L2 distribution could potentially be due to focal inflammation at L2-L3 for vertebrae/disc.  Of note x-ray done at previous office visit did not show degenerative changes at that level but instead lower. She did have dry needling early on in physical therapy that potentially could have irritated the L2 nerve peripherally. We discussed possible further evaluation versus treatment.  We discussed considering nerve conduction versus MRI of spine. Given there is minimal pain she is opted to forego evaluation first and instead try treatment.  I have recommended prednisone  taper to help with inflammation of nerve.  She will do gentle range of motion stretching but will hold formal physical therapy at the moment.  No red flags or need for urgent imaging.  Reviewed red flags with patient and she will contact us  if her symptoms are worsening.  ER and return precautions provided.

## 2024-04-27 NOTE — Progress Notes (Signed)
 Patient ID: Summer Hernandez, female    DOB: 1961-06-12, 63 y.o.   MRN: [id number]  This visit was conducted in person.  BP 124/72   Pulse 78   Temp 98.4 F (36.9 C) (Oral)   Ht 5' 2.5" (1.588 m)   Wt 184 lb 12.8 oz (83.8 kg)   SpO2 100%   BMI 33.26 kg/m    CC:  Chief Complaint  Patient presents with   Numbness    Patient states that she has been having some tingling and numbness in her right thigh for the past 3 weeks.    Subjective:   HPI: Summer Hernandez is a 63 y.o. female presenting on 04/27/2024 for Numbness (Patient states that she has been having some tingling and numbness in her right thigh for the past 3 weeks.)   New onset tingling in right medial inner thigh in last 3 weeks. In L2 distribution.  Slightly numb.. does not come and go.   Doing PT  for hip.. 03/2024.. tingling started few weeks after started PT. Has had dry needling.  Hips have improved.... was originally Bilateral.. now now none left   Now in right lower back/buttock sore.  Using ibuprofen and tylenol.   No numbness in feet   No perineal numbness. No new incontinence.          Relevant past medical, surgical, family and social history reviewed and updated as indicated. Interim medical history since our last visit reviewed. Allergies and medications reviewed and updated. Outpatient Medications Prior to Visit  Medication Sig Dispense Refill   aspirin 81 MG EC tablet      Calcium Carbonate-Vitamin D (CALCIUM-VITAMIN D) 600-200 MG-UNIT CAPS Take 1 capsule by mouth 2 (two) times daily.     celecoxib (CELEBREX) 50 MG capsule Take 50 mg by mouth as needed for pain.     levothyroxine  (SYNTHROID ) 50 MCG tablet TAKE 1 TABLET BY MOUTH EVERY DAY 90 tablet 3   losartan -hydrochlorothiazide (HYZAAR) 50-12.5 MG tablet TAKE 1 TABLET BY MOUTH EVERY DAY 90 tablet 3   Misc Natural Products (GLUCOSAMINE CHOND CMP ADVANCED) TABS      omeprazole (PRILOSEC) 20 MG capsule Take 20 mg by mouth every other day.       pravastatin  (PRAVACHOL ) 20 MG tablet TAKE 1 TABLET BY MOUTH EVERY DAY 90 tablet 3   SOOLANTRA 1 % CREA Apply topically.     traZODone  (DESYREL ) 50 MG tablet Take 0.5-1 tablets (25-50 mg total) by mouth at bedtime as needed for sleep. 30 tablet 3   No facility-administered medications prior to visit.     Per HPI unless specifically indicated in ROS section below Review of Systems  Constitutional:  Negative for fatigue and fever.  HENT:  Negative for congestion.   Eyes:  Negative for pain.  Respiratory:  Negative for cough and shortness of breath.   Cardiovascular:  Negative for chest pain, palpitations and leg swelling.  Gastrointestinal:  Negative for abdominal pain.  Genitourinary:  Negative for dysuria and vaginal bleeding.  Musculoskeletal:  Negative for back pain.  Neurological:  Negative for syncope, light-headedness and headaches.  Psychiatric/Behavioral:  Negative for dysphoric mood.    Objective:  BP 124/72   Pulse 78   Temp 98.4 F (36.9 C) (Oral)   Ht 5' 2.5" (1.588 m)   Wt 184 lb 12.8 oz (83.8 kg)   SpO2 100%   BMI 33.26 kg/m   Wt Readings from Last 3 Encounters:  04/27/24 184 lb 12.8  oz (83.8 kg)  03/10/24 186 lb 6 oz (84.5 kg)  02/11/24 185 lb (83.9 kg)      Physical Exam Constitutional:      General: She is not in acute distress.    Appearance: Normal appearance. She is well-developed. She is not ill-appearing or toxic-appearing.  HENT:     Head: Normocephalic.     Right Ear: Hearing, tympanic membrane, ear canal and external ear normal. Tympanic membrane is not erythematous, retracted or bulging.     Left Ear: Hearing, tympanic membrane, ear canal and external ear normal. Tympanic membrane is not erythematous, retracted or bulging.     Nose: No mucosal edema or rhinorrhea.     Right Sinus: No maxillary sinus tenderness or frontal sinus tenderness.     Left Sinus: No maxillary sinus tenderness or frontal sinus tenderness.     Mouth/Throat:     Pharynx:  Uvula midline.  Eyes:     General: Lids are normal. Lids are everted, no foreign bodies appreciated.     Conjunctiva/sclera: Conjunctivae normal.     Pupils: Pupils are equal, round, and reactive to light.  Neck:     Thyroid : No thyroid  mass or thyromegaly.     Vascular: No carotid bruit.     Trachea: Trachea normal.  Cardiovascular:     Rate and Rhythm: Normal rate and regular rhythm.     Pulses: Normal pulses.     Heart sounds: Normal heart sounds, S1 normal and S2 normal. No murmur heard.    No friction rub. No gallop.  Pulmonary:     Effort: Pulmonary effort is normal. No tachypnea or respiratory distress.     Breath sounds: Normal breath sounds. No decreased breath sounds, wheezing, rhonchi or rales.  Abdominal:     General: Bowel sounds are normal.     Palpations: Abdomen is soft.     Tenderness: There is no abdominal tenderness.  Musculoskeletal:     Cervical back: Normal range of motion and neck supple.     Thoracic back: Normal. No tenderness or bony tenderness. Normal range of motion.     Lumbar back: No spasms, tenderness or bony tenderness. Normal range of motion. Negative right straight leg raise test and negative left straight leg raise test.  Skin:    General: Skin is warm and dry.     Findings: No rash.  Neurological:     Mental Status: She is alert and oriented to person, place, and time.     Cranial Nerves: Cranial nerves 2-12 are intact.     Sensory: Sensation is intact.     Motor: Motor function is intact.     Coordination: Coordination is intact.     Gait: Gait is intact.     Comments: Numbness and tingling in L2 distribution  right medial thigh  Psychiatric:        Mood and Affect: Mood is not anxious or depressed.        Speech: Speech normal.        Behavior: Behavior normal. Behavior is cooperative.        Thought Content: Thought content normal.        Judgment: Judgment normal.       Results for orders placed or performed in visit on 02/02/24   T3, free   Collection Time: 02/02/24  8:07 AM  Result Value Ref Range   T3, Free 2.9 2.3 - 4.2 pg/mL  T4, free   Collection Time: 02/02/24  8:07 AM  Result Value Ref Range   Free T4 0.94 0.60 - 1.60 ng/dL  TSH   Collection Time: 02/02/24  8:07 AM  Result Value Ref Range   TSH 3.34 0.35 - 5.50 uIU/mL  Lipid panel   Collection Time: 02/02/24  8:07 AM  Result Value Ref Range   Cholesterol 188 0 - 200 mg/dL   Triglycerides 16.1 0.0 - 149.0 mg/dL   HDL 09.60 >45.40 mg/dL   VLDL 98.1 0.0 - 19.1 mg/dL   LDL Cholesterol 478 (H) 0 - 99 mg/dL   Total CHOL/HDL Ratio 3    NonHDL 127.36   Comprehensive metabolic panel   Collection Time: 02/02/24  8:07 AM  Result Value Ref Range   Sodium 141 135 - 145 mEq/L   Potassium 4.1 3.5 - 5.1 mEq/L   Chloride 103 96 - 112 mEq/L   CO2 28 19 - 32 mEq/L   Glucose, Bld 98 70 - 99 mg/dL   BUN 19 6 - 23 mg/dL   Creatinine, Ser 2.95 0.40 - 1.20 mg/dL   Total Bilirubin 0.7 0.2 - 1.2 mg/dL   Alkaline Phosphatase 47 39 - 117 U/L   AST 14 0 - 37 U/L   ALT 12 0 - 35 U/L   Total Protein 6.9 6.0 - 8.3 g/dL   Albumin 4.5 3.5 - 5.2 g/dL   GFR 62.13 >08.65 mL/min   Calcium 9.7 8.4 - 10.5 mg/dL    Assessment and Plan  Numbness of right anterior thigh Assessment & Plan: Acute, numbness in L2 distribution began after patient started physical therapy for bilateral hip pain.  Hip pain is improved but she does have some tenderness reported although not on exam in right sciatic notch. Numbness in right medial thigh and L2 distribution could potentially be due to focal inflammation at L2-L3 for vertebrae/disc.  Of note x-ray done at previous office visit did not show degenerative changes at that level but instead lower. She did have dry needling early on in physical therapy that potentially could have irritated the L2 nerve peripherally. We discussed possible further evaluation versus treatment.  We discussed considering nerve conduction versus MRI of  spine. Given there is minimal pain she is opted to forego evaluation first and instead try treatment.  I have recommended prednisone  taper to help with inflammation of nerve.  She will do gentle range of motion stretching but will hold formal physical therapy at the moment.  No red flags or need for urgent imaging.  Reviewed red flags with patient and she will contact us  if her symptoms are worsening.  ER and return precautions provided.   Other orders -     predniSONE ; 3 tabs by mouth daily x 3 days, then 2 tabs by mouth daily x 2 days then 1 tab by mouth daily x 2 days  Dispense: 15 tablet; Refill: 0    No follow-ups on file.   Herby Lolling, MD

## 2024-04-29 ENCOUNTER — Ambulatory Visit: Admitting: Physical Therapy

## 2024-05-04 ENCOUNTER — Ambulatory Visit: Admitting: Physical Therapy

## 2024-05-06 ENCOUNTER — Other Ambulatory Visit: Payer: Self-pay | Admitting: Family Medicine

## 2024-05-09 ENCOUNTER — Ambulatory Visit: Attending: Family Medicine | Admitting: Physical Therapy

## 2024-05-09 DIAGNOSIS — M25552 Pain in left hip: Secondary | ICD-10-CM | POA: Diagnosis present

## 2024-05-09 DIAGNOSIS — M25551 Pain in right hip: Secondary | ICD-10-CM | POA: Insufficient documentation

## 2024-05-09 DIAGNOSIS — M25651 Stiffness of right hip, not elsewhere classified: Secondary | ICD-10-CM | POA: Diagnosis present

## 2024-05-09 DIAGNOSIS — M6281 Muscle weakness (generalized): Secondary | ICD-10-CM | POA: Insufficient documentation

## 2024-05-09 NOTE — Therapy (Signed)
 OUTPATIENT PHYSICAL THERAPY LOWER EXTREMITY TREATMENT Physical Therapy Progress Note   Dates of reporting period  03/23/24   to   05/09/24    Patient Name: LUX SKILTON MRN: [id number] DOB:1961-08-07, 63 y.o., female Today's Date: 05/09/2024  END OF SESSION:  PT End of Session - 05/09/24 0928     Visit Number 10    Number of Visits 16    Date for PT Re-Evaluation 05/18/24    Progress Note Due on Visit 10    PT Start Time 0845    PT Stop Time 0926    PT Time Calculation (min) 41 min    Activity Tolerance Patient tolerated treatment well    Behavior During Therapy [facility] for tasks assessed/performed                 Past Medical History:  Diagnosis Date   GERD (gastroesophageal reflux disease)    Hyperlipidemia    Hypertension    Shortness of breath dyspnea    Thyroid  disease    Past Surgical History:  Procedure Laterality Date   CESAREAN SECTION     CHOLECYSTECTOMY  12/29/06   CHOLECYSTECTOMY, LAPAROSCOPIC     COLONOSCOPY N/A 04/27/2015   Procedure: COLONOSCOPY;  Surgeon: Cassie Click, MD;  Location: [facility] ENDOSCOPY;  Service: Endoscopy;  Laterality: N/A;   PLANTAR FASCIA SURGERY Right 12/08/2010   plantar fascitis     TUBAL LIGATION  01/18/86   Patient Active Problem List   Diagnosis Date Noted   Numbness of right medial thigh 04/27/2024   Bee sting reaction 07/03/2020   Arthritis of carpometacarpal (CMC) joint of right thumb 01/24/2020   Bilateral hip pain 01/24/2020   Acute pain of left shoulder 01/20/2019   Acute pain of right knee 01/20/2019   Hypothyroidism 01/15/2018   ACNE ROSACEA 11/19/2010   HYPERCHOLESTEROLEMIA 05/16/2010   Essential hypertension, benign 02/04/2010    PCP:   Judithann Novas, MD    REFERRING PROVIDER:   Judithann Novas, MD    REFERRING DIAG: [id number] (ICD-10-CM) - Bilateral hip pain   THERAPY DIAG:  Pain in right hip  Pain in left hip  Muscle weakness (generalized)  Rationale for Evaluation and Treatment:  Rehabilitation  ONSET DATE: 10/2023  SUBJECTIVE:   SUBJECTIVE STATEMENT: Pt reports she is feeling better. She is still having numbness. Pt had some pain yesterday when she was working.  Overall patient feels she is doing much better.  Eval:  Last fall pt began having pain in her right hip that descended into her right thigh. Pt MD thought it was bursitis and was prescribed prednisone  ( not effective) and exercises ( not completed per report). Pt reports rest is helpful but she likes to stay active. Pt likes to play pickle-ball and walk for exercise ( can't do at the moment due to her knee per her MD).   PERTINENT HISTORY: Pmx of GERD, HLD, HTN PAIN:  Are you having pain? Yes: NPRS scale: 3/10 with movement. Pain location: B hips anterior, but greater on the R hip posterior Pain description: heaviness, awkwardness  Aggravating factors: increased activity Relieving factors: rest   PRECAUTIONS: None  RED FLAGS: None   WEIGHT BEARING RESTRICTIONS: No  FALLS:  Has patient fallen in last 6 months? No  LIVING ENVIRONMENT: Lives with: lives with their spouse Lives in: House/apartment Stairs: Yes: External: 16 steps; on right going up Has following equipment at home: None  OCCUPATION: Retired Engineer, site, working part time at [employer]  PLOF: Independent  PATIENT GOALS: To improve pain and improve activity tolerance   NEXT MD VISIT: No follow up scheduled   OBJECTIVE:  Note: Objective measures were completed at Evaluation unless otherwise noted.  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    PATIENT SURVEYS:  LEFS 34  COGNITION: Overall cognitive status: Within functional limits for tasks assessed     SENSATION: Not tested  EDEMA:  None present   MUSCLE LENGTH: Hamstrings: Right 35 deg; WNL Thomas test:Post R>L for quad tightness     PALPATION: Assess next visit if  indicated   LOWER EXTREMITY WUJ:[id number] Hip IR and ER in future session  P ROM Right eval Left eval  Hip flexion WNL WNL  Hip extension    Hip abduction    Hip adduction    Hip internal rotation    Hip external rotation    Knee flexion WNL WNL  Knee extension WNL WNL  Ankle dorsiflexion    Ankle plantarflexion    Ankle inversion    Ankle eversion     (Blank rows = not tested)  LOWER EXTREMITY MMT: -used active force and took pek muscle force and listed below # = lb  MMT Right eval Left eval 6/2 Right  Hip flexion -supine 12.6#* 25# 24.9#  Hip extension- prone 17.5# 21.3#   Hip abduction - SL 14.7# 22# 19.1#  Hip adduction     Hip internal rotation     Hip external rotation     Knee flexion     Knee extension 26.2 #* 29#   Ankle dorsiflexion 29.4# 29.3#   Ankle plantarflexion     Ankle inversion     Ankle eversion      (Blank rows = not tested) *indicates pain with test/ movement   LOWER EXTREMITY SPECIAL TESTS:  Hip special tests: Portia Brittle (FABER) test: positive  and Thomas test: positive  Knee special tests: none this date, pt has reported R meniscal tear and has bad prior PT for this   FUNCTIONAL TESTS:  5 times sit to stand: 14.6 sec   GAIT: Distance walked: 30 ft Assistive device utilized: None Level of assistance: Complete Independence Comments: Hip ER, slight tredelenberg hip drop                                                                                                                                TREATMENT DATE: 05/09/24  Physical therapy treatment session today consisted of completing assessment of goals and administration of testing as demonstrated and documented in flow sheet, treatment, and goals section of this note. Addition treatments may be found below.   NMR PPT with march x 10  PPT with ball adduction  TrA activation with march 2 x 10 each LE Bridge with hip abduction 2 x 10    Physical performance: See evaluation manual muscle  testing chart for performance using active  force technology to measure patient's strength.  Five times Sit to Stand Test (FTSS)  TIME: 8.68 sec  Cut off scores indicative of increased fall risk: >12 sec CVA, >16 sec PD, >13 sec vestibular (ANPTA Core Set of Outcome Measures for Adults with Neurologic Conditions, 2018)     PATIENT EDUCATION: Education details: POC. Pt educated throughout session about proper posture and technique with exercises. Improved exercise technique, movement at target joints, use of target muscles after min to mod verbal, visual, tactile cues. - Importance of warm up and cool down with Physical activity to reduce residual soreness and pain/injury.   Person educated: Patient Education method: Explanation Education comprehension: verbalized understanding   HOME EXERCISE PROGRAM:  Access Code: [email or web address] URL: [email or web address] Date: 04/14/2024 Prepared by: Marlynn Singer  Exercises - Active Straight Leg Raise with Quad Set  - 1 x daily - 5 x weekly - 2 sets - 10 reps - Prone Hip Extension  - 1 x daily - 5 x weekly - 2 sets - 10 reps - Sidelying Hip Abduction  - 1 x daily - 5 x weekly - 2 sets - 10 reps - Supine Bridge  - 1 x daily - 5 x weekly - 1 sets - 10 reps - Bridge with Heels on [employer]  - 1 x daily - 5 x weekly - 1 sets - 10 reps - Side Plank on Knees  - 1 x daily - 5 x weekly - 2 sets - 15 sec hold - Beginner Front Arm Support  - 1 x daily - 7 x weekly - 1 sets - 10 reps - 3 sec  hold   ASSESSMENT:  CLINICAL IMPRESSION:  Patient presents to physical therapy for progress note this date.  Patient does well with goal assessment meeting nearly all of her physical therapy goals.  Patient also subjectively reports significant improvement in her overall function since starting physical therapy.  Patient still feels she has a little bit to go towards her maximum function and would like an updated HEP with advanced exercises.  Will  address this next session and also provide patient with a warm up routine to perform with pickleball to avoid any aggravation of pain when she gets back to playing in the future.  Patient's condition has the potential to improve in response to therapy. Maximum improvement is yet to be obtained. The anticipated improvement is attainable and reasonable in a generally predictable time.      OBJECTIVE IMPAIRMENTS: Abnormal gait, decreased activity tolerance, decreased mobility, decreased strength, and pain.   ACTIVITY LIMITATIONS: squatting and locomotion level  PARTICIPATION LIMITATIONS: community activity and yard work  PERSONAL FACTORS: 3+ comorbidities: GERD, HLD, HTN are also affecting patient's functional outcome.   REHAB POTENTIAL: Good  CLINICAL DECISION MAKING: Evolving/moderate complexity  EVALUATION COMPLEXITY: Moderate   GOALS: Goals reviewed with patient? Yes  SHORT TERM GOALS: Target date: 04/20/2024     Patient will be independent in home exercise program to improve strength/mobility for better functional independence with ADLs. Baseline: No HEP currently 6/2: Pt has been completing 4-5 times per week.  Goal status: MET    LONG TERM GOALS: Target date: 05/18/2024   1.  Patient  will complete five times sit to stand test in < 11 seconds indicating an increased LE strength and improved balance. Baseline: 14.6 s 6/2:8.68 sec  Goal status: MET  2.  Patient will improve LEFS score to 44   to demonstrate statistically significant improvement in mobility and quality  of life as it relates to their LE pain.  Baseline: 34 6/2:63 Goal status: MET    3.  Patient will improve left and right hip abduction and flexion strength by 5 pounds of force or greater using active force tools in order to indicate improved lower extremity strength for activities such as pickleball. Baseline: see eval chart  Goal status: MET  4.  Patient report moderate difficulty or less difficulty  with putting on her socks and shoes in order to indicate improved hip mobility for functional activities.  Baseline: quite a bit of difficulty 6/2: Pt having light to moderate difficulty on the R and none on the left  Goal status: ONGOING     PLAN:  PT FREQUENCY: 2x/week  PT DURATION: 8 weeks  PLANNED INTERVENTIONS: 97750- Physical Performance Testing, 97110-Therapeutic exercises, 97530- Therapeutic activity, 97112- Neuromuscular re-education, 97535- Self Care, 16109- Manual therapy, [phone]- Gait training, Patient/Family education, Stair training, Taping, Dry Needling, Joint mobilization, Spinal mobilization, Cryotherapy, and Moist heat  PLAN FOR NEXT SESSION:   Hip mobility continue strengthening interventions  Lumbar/core stability.  Establish final HEP and provide appropriate warm up routine for pickle ball ( core stability and hip mobility routine)    Edwina Gram, PT 05/09/2024, 1:07 PM

## 2024-05-10 ENCOUNTER — Encounter: Payer: Self-pay | Admitting: Family Medicine

## 2024-05-10 ENCOUNTER — Ambulatory Visit (INDEPENDENT_AMBULATORY_CARE_PROVIDER_SITE_OTHER): Admitting: Family Medicine

## 2024-05-10 VITALS — BP 110/72 | HR 71 | Temp 97.8°F | Ht 62.5 in | Wt 185.2 lb

## 2024-05-10 DIAGNOSIS — J019 Acute sinusitis, unspecified: Secondary | ICD-10-CM | POA: Insufficient documentation

## 2024-05-10 MED ORDER — AMOXICILLIN 500 MG PO CAPS: 1000.0000 mg | ORAL_CAPSULE | Freq: Two times a day (BID) | ORAL | 0 refills | Status: AC

## 2024-05-10 NOTE — Assessment & Plan Note (Signed)
 Acute, most likely viral URI or allergic sinusitis.  Will treat with nasal saline, flonase 2 sprays per nostril daily and mucolytic (mucinex).  Given she is going out of town, I will provide a prescription for her for amoxicillin  500 mg 2 tablets twice daily for 10 days if her symptoms progress to suggest bacterial sinusitis.  She will start the antibiotic if she has fever, unilateral face pain or purulent mucus.  Return and ER precautions provided.

## 2024-05-10 NOTE — Progress Notes (Signed)
 Patient ID: Summer Hernandez, female    DOB: Nov 21, 1961, 63 y.o.   MRN: [id number]  This visit was conducted in person.  BP 110/72   Pulse 71   Temp 97.8 F (36.6 C) (Temporal)   Ht 5' 2.5" (1.588 m)   Wt 185 lb 4 oz (84 kg)   SpO2 99%   BMI 33.34 kg/m    CC:  Chief Complaint  Patient presents with   Scratchy Throat    Leaving for [place]  tomorrow Symptoms started Friday afternoon   Cough   Nasal Congestion   Sneezing    Subjective:   HPI: Summer Hernandez is a 63 y.o. female presenting on 05/10/2024 for Scratchy Throat (Leaving for [place]  tomorrow/Symptoms started Friday afternoon), Cough, Nasal Congestion, and Sneezing   Date of onset:  5 days Initial symptoms included  post nasal drip, sneeze, scratchy throat Symptoms progressed to cough, productive.. clear mucus.  No fever. Facial heaviness.  No SOB.    No face pain, no ear pain.   Sick contacts:  none COVID testing:   none     She has tried to treat with   using sinusex,  no antihistamine.     No history of chronic lung disease such as asthma or COPD. Non-smoker.   Going out of town.     Relevant past medical, surgical, family and social history reviewed and updated as indicated. Interim medical history since our last visit reviewed. Allergies and medications reviewed and updated. Outpatient Medications Prior to Visit  Medication Sig Dispense Refill   aspirin 81 MG EC tablet      Calcium Carbonate-Vitamin D (CALCIUM-VITAMIN D) 600-200 MG-UNIT CAPS Take 1 capsule by mouth 2 (two) times daily.     celecoxib (CELEBREX) 50 MG capsule Take 50 mg by mouth as needed for pain.     levothyroxine  (SYNTHROID ) 50 MCG tablet TAKE 1 TABLET BY MOUTH EVERY DAY 90 tablet 3   losartan -hydrochlorothiazide (HYZAAR) 50-12.5 MG tablet TAKE 1 TABLET BY MOUTH EVERY DAY 90 tablet 3   Misc Natural Products (GLUCOSAMINE CHOND CMP ADVANCED) TABS      omeprazole (PRILOSEC) 20 MG capsule Take 20 mg by mouth every other day.       pravastatin  (PRAVACHOL ) 20 MG tablet TAKE 1 TABLET BY MOUTH EVERY DAY 90 tablet 3   SOOLANTRA 1 % CREA Apply topically.     traZODone  (DESYREL ) 50 MG tablet TAKE 0.5-1 TABLETS BY MOUTH AT BEDTIME AS NEEDED FOR SLEEP. 90 tablet 0   predniSONE  (DELTASONE ) 20 MG tablet 3 tabs by mouth daily x 3 days, then 2 tabs by mouth daily x 2 days then 1 tab by mouth daily x 2 days 15 tablet 0   No facility-administered medications prior to visit.     Per HPI unless specifically indicated in ROS section below Review of Systems  Constitutional:  Negative for fatigue and fever.  HENT:  Negative for congestion.   Eyes:  Negative for pain.  Respiratory:  Negative for cough and shortness of breath.   Cardiovascular:  Negative for chest pain, palpitations and leg swelling.  Gastrointestinal:  Negative for abdominal pain.  Genitourinary:  Negative for dysuria and vaginal bleeding.  Musculoskeletal:  Negative for back pain.  Neurological:  Negative for syncope, light-headedness and headaches.  Psychiatric/Behavioral:  Negative for dysphoric mood.    Objective:  BP 110/72   Pulse 71   Temp 97.8 F (36.6 C) (Temporal)   Ht 5' 2.5" (1.588 m)  Wt 185 lb 4 oz (84 kg)   SpO2 99%   BMI 33.34 kg/m   Wt Readings from Last 3 Encounters:  05/10/24 185 lb 4 oz (84 kg)  04/27/24 184 lb 12.8 oz (83.8 kg)  03/10/24 186 lb 6 oz (84.5 kg)      Physical Exam Constitutional:      General: She is not in acute distress.    Appearance: Normal appearance. She is well-developed. She is not ill-appearing or toxic-appearing.  HENT:     Head: Normocephalic.     Right Ear: Hearing, tympanic membrane, ear canal and external ear normal. Tympanic membrane is not erythematous, retracted or bulging.     Left Ear: Hearing, tympanic membrane, ear canal and external ear normal. Tympanic membrane is not erythematous, retracted or bulging.     Nose: Congestion present. No mucosal edema or rhinorrhea.     Right Sinus: No maxillary  sinus tenderness or frontal sinus tenderness.     Left Sinus: No maxillary sinus tenderness or frontal sinus tenderness.     Mouth/Throat:     Pharynx: Uvula midline. No oropharyngeal exudate or posterior oropharyngeal erythema.  Eyes:     General: Lids are normal. Lids are everted, no foreign bodies appreciated.     Conjunctiva/sclera: Conjunctivae normal.     Pupils: Pupils are equal, round, and reactive to light.  Neck:     Thyroid : No thyroid  mass or thyromegaly.     Vascular: No carotid bruit.     Trachea: Trachea normal.  Cardiovascular:     Rate and Rhythm: Normal rate and regular rhythm.     Pulses: Normal pulses.     Heart sounds: Normal heart sounds, S1 normal and S2 normal. No murmur heard.    No friction rub. No gallop.  Pulmonary:     Effort: Pulmonary effort is normal. No tachypnea or respiratory distress.     Breath sounds: Normal breath sounds. No decreased breath sounds, wheezing, rhonchi or rales.  Abdominal:     General: Bowel sounds are normal.     Palpations: Abdomen is soft.     Tenderness: There is no abdominal tenderness.  Musculoskeletal:     Cervical back: Normal range of motion and neck supple.  Skin:    General: Skin is warm and dry.     Findings: No rash.  Neurological:     Mental Status: She is alert.  Psychiatric:        Mood and Affect: Mood is not anxious or depressed.        Speech: Speech normal.        Behavior: Behavior normal. Behavior is cooperative.        Thought Content: Thought content normal.        Judgment: Judgment normal.       Results for orders placed or performed in visit on 02/02/24  T3, free   Collection Time: 02/02/24  8:07 AM  Result Value Ref Range   T3, Free 2.9 2.3 - 4.2 pg/mL  T4, free   Collection Time: 02/02/24  8:07 AM  Result Value Ref Range   Free T4 0.94 0.60 - 1.60 ng/dL  TSH   Collection Time: 02/02/24  8:07 AM  Result Value Ref Range   TSH 3.34 0.35 - 5.50 uIU/mL  Lipid panel   Collection Time:  02/02/24  8:07 AM  Result Value Ref Range   Cholesterol 188 0 - 200 mg/dL   Triglycerides 16.1 0.0 - 149.0 mg/dL  HDL 60.90 >39.00 mg/dL   VLDL 13.0 0.0 - 86.5 mg/dL   LDL Cholesterol 784 (H) 0 - 99 mg/dL   Total CHOL/HDL Ratio 3    NonHDL 127.36   Comprehensive metabolic panel   Collection Time: 02/02/24  8:07 AM  Result Value Ref Range   Sodium 141 135 - 145 mEq/L   Potassium 4.1 3.5 - 5.1 mEq/L   Chloride 103 96 - 112 mEq/L   CO2 28 19 - 32 mEq/L   Glucose, Bld 98 70 - 99 mg/dL   BUN 19 6 - 23 mg/dL   Creatinine, Ser 6.96 0.40 - 1.20 mg/dL   Total Bilirubin 0.7 0.2 - 1.2 mg/dL   Alkaline Phosphatase 47 39 - 117 U/L   AST 14 0 - 37 U/L   ALT 12 0 - 35 U/L   Total Protein 6.9 6.0 - 8.3 g/dL   Albumin 4.5 3.5 - 5.2 g/dL   GFR 29.52 >84.13 mL/min   Calcium 9.7 8.4 - 10.5 mg/dL    Assessment and Plan  There are no diagnoses linked to this encounter.  No follow-ups on file.   Herby Lolling, MD

## 2024-05-11 ENCOUNTER — Ambulatory Visit: Admitting: Physical Therapy

## 2024-05-11 ENCOUNTER — Encounter: Admitting: Physical Therapy

## 2024-05-16 ENCOUNTER — Ambulatory Visit

## 2024-05-17 ENCOUNTER — Encounter: Admitting: Physical Therapy

## 2024-05-18 ENCOUNTER — Ambulatory Visit: Admitting: Physical Therapy

## 2024-05-19 ENCOUNTER — Encounter: Admitting: Physical Therapy

## 2024-05-23 ENCOUNTER — Ambulatory Visit: Admitting: Physical Therapy

## 2024-05-25 ENCOUNTER — Encounter: Admitting: Physical Therapy

## 2024-05-27 ENCOUNTER — Ambulatory Visit: Admitting: Physical Therapy

## 2024-05-27 DIAGNOSIS — M25552 Pain in left hip: Secondary | ICD-10-CM

## 2024-05-27 DIAGNOSIS — M6281 Muscle weakness (generalized): Secondary | ICD-10-CM

## 2024-05-27 DIAGNOSIS — M25551 Pain in right hip: Secondary | ICD-10-CM

## 2024-05-27 DIAGNOSIS — M25651 Stiffness of right hip, not elsewhere classified: Secondary | ICD-10-CM

## 2024-05-27 NOTE — Therapy (Signed)
 OUTPATIENT PHYSICAL THERAPY LOWER EXTREMITY TREATMENT/ RECERT / Discharge     Patient Name: Summer Hernandez MRN: [id number] DOB:[birth year], 63 y.o., female Today's Date: 05/27/2024  END OF SESSION:  PT End of Session - 05/27/24 0753     Visit Number 11    Number of Visits 16    Date for PT Re-Evaluation 05/18/24    Progress Note Due on Visit 20    PT Start Time 0801    PT Stop Time 0842    PT Time Calculation (min) 41 min    Activity Tolerance Patient tolerated treatment well    Behavior During Therapy [facility] for tasks assessed/performed              Past Medical History:  Diagnosis Date   GERD (gastroesophageal reflux disease)    Hyperlipidemia    Hypertension    Shortness of breath dyspnea    Thyroid  disease    Past Surgical History:  Procedure Laterality Date   CESAREAN SECTION     CHOLECYSTECTOMY  12/29/06   CHOLECYSTECTOMY, LAPAROSCOPIC     COLONOSCOPY N/A 04/27/2015   Procedure: COLONOSCOPY;  Surgeon: Cassie Click, MD;  Location[facility] ENDOSCOPY;  Service: Endoscopy;  Laterality: N/A;   PLANTAR FASCIA SURGERY Right 12/08/2010   plantar fascitis     TUBAL LIGATION  01/18/86   Patient Active Problem List   Diagnosis Date Noted   Acute non-recurrent sinusitis 05/10/2024   Numbness of right medial thigh 04/27/2024   Bee sting reaction 07/03/2020   Arthritis of carpometacarpal [facility]) joint of right thumb 01/24/2020   Bilateral hip pain 01/24/2020   Acute pain of left shoulder 01/20/2019   Acute pain of right knee 01/20/2019   Hypothyroidism 01/15/2018   ACNE ROSACEA 11/19/2010   HYPERCHOLESTEROLEMIA 05/16/2010   Essential hypertension, benign 02/04/2010    PCP:   Judithann Novas, MD    REFERRING PROVIDER:   Judithann Novas, MD    REFERRING DIAG: [phone] (ICD-10-CM) - Bilateral hip pain   THERAPY DIAG:  Pain in right hip  Pain in left hip  Muscle weakness (generalized)  Stiffness of right hip, not elsewhere classified  Rationale  for Evaluation and Treatment: Rehabilitation  ONSET DATE: 10/2023  SUBJECTIVE:   SUBJECTIVE STATEMENT: Pt reports a very mild ache in her back but no pain in her hips this date. Reports trip went well.   Eval:  Last fall pt began having pain in her right hip that descended into her right thigh. Pt MD thought it was bursitis and was prescribed prednisone  ( not effective) and exercises ( not completed per report). Pt reports rest is helpful but she likes to stay active. Pt likes to play pickle-ball and walk for exercise ( can't do at the moment due to her knee per her MD).   PERTINENT HISTORY: Pmx of GERD, HLD, HTN PAIN:  Are you having pain? Yes: NPRS scale: 3/10 with movement. Pain location: B hips anterior, but greater on the R hip posterior Pain description: heaviness, awkwardness  Aggravating factors: increased activity Relieving factors: rest   PRECAUTIONS: None  RED FLAGS: None   WEIGHT BEARING RESTRICTIONS: No  FALLS:  Has patient fallen in last 6 months? No  LIVING ENVIRONMENT: Lives with: lives with their spouse Lives in: House/apartment Stairs: Yes: External: 16 steps; on right going up Has following equipment at home: None  OCCUPATION: Retired Engineer, site, working part time at [employer]   PLOF: Independent  PATIENT GOALS: To improve pain  and improve activity tolerance   NEXT MD VISIT: No follow up scheduled   OBJECTIVE:  Note: Objective measures were completed at Evaluation unless otherwise noted.  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    PATIENT SURVEYS:  LEFS 34  COGNITION: Overall cognitive status: Within functional limits for tasks assessed     SENSATION: Not tested  EDEMA:  None present   MUSCLE LENGTH: Hamstrings: Right 35 deg; WNL Thomas test:Post R>L for quad tightness     PALPATION: Assess next visit if indicated   LOWER EXTREMITY  EPP:IRJJOAC Hip IR and ER in future session  P ROM Right eval Left eval  Hip flexion WNL WNL  Hip extension    Hip abduction    Hip adduction    Hip internal rotation    Hip external rotation    Knee flexion WNL WNL  Knee extension WNL WNL  Ankle dorsiflexion    Ankle plantarflexion    Ankle inversion    Ankle eversion     (Blank rows = not tested)  LOWER EXTREMITY MMT: -used active force and took pek muscle force and listed below # = lb  MMT Right eval Left eval 6/2 Right  Hip flexion -supine 12.6#* 25# 24.9#  Hip extension- prone 17.5# 21.3#   Hip abduction - SL 14.7# 22# 19.1#  Hip adduction     Hip internal rotation     Hip external rotation     Knee flexion     Knee extension 26.2 #* 29#   Ankle dorsiflexion 29.4# 29.3#   Ankle plantarflexion     Ankle inversion     Ankle eversion      (Blank rows = not tested) *indicates pain with test/ movement   LOWER EXTREMITY SPECIAL TESTS:  Hip special tests: Portia Brittle (FABER) test: positive  and Thomas test: positive  Knee special tests: none this date, pt has reported R meniscal tear and has bad prior PT for this   FUNCTIONAL TESTS:  5 times sit to stand: 14.6 sec   GAIT: Distance walked: 30 ft Assistive device utilized: None Level of assistance: Complete Independence Comments: Hip ER, slight tredelenberg hip drop                                                                                                                                TREATMENT DATE: 05/27/24   Pt introduced to finalized HEP and warm up exercises to complete prior to pickleball.    NMR: for improved core muscle activation and control   - Bridge with Heels on [employer]  - 1 x daily - 2-3 x weekly - 2 sets - 12 reps - Side Plank on Knees  - 3 x daily - 2-3 x weekly - 2 sets - 30 sec hold - Bird Dog  - 3 x daily - 2-3 x weekly - 2 sets - 10 reps -  3 sec  hold - Side Stepping with Resistance at Ankles  - 3 x daily - 2-3 x weekly - 2 sets -  15 reps  TA Exercises - Lateral Lunge  - 1 x daily - 7 x weekly - 3 sets - 10 reps - Reverse Lunge   - 1 sets - 10 reps   Self care:  Instructed in ways to manage any pain flare ups without PT intervention and indications for coming back to PT if needed. Instructed in importance of exercises and appropriate warm up.   PATIENT EDUCATION: Education details: - Importance of warm up and cool down with Physical activity to reduce residual soreness and pain/injury.    Person educated: Patient Education method: Explanation Education comprehension: verbalized understanding   HOME EXERCISE PROGRAM:  Access Code: [email or web address] URL: [email or web address] Date: 05/27/2024 Prepared by: Marlynn Singer  Exercises - Bridge with Heels on [employer]  - 1 x daily - 2-3 x weekly - 2 sets - 12 reps - Side Plank on Knees  - 3 x daily - 2-3 x weekly - 2 sets - 30 sec hold - Bird Dog  - 3 x daily - 2-3 x weekly - 2 sets - 10 reps - 3 sec  hold - Side Stepping with Resistance at Ankles  - 3 x daily - 2-3 x weekly - 2 sets - 15 reps  Pickleball warm up Access Code: [email or web address] URL: [email or web address] Date: 05/27/2024 Prepared by: Marlynn Singer  Exercises - Lateral Lunge  - 1 x daily - 7 x weekly - 3 sets - 10 reps - Reverse Lunge   - 1 sets - 10 reps   ASSESSMENT:  CLINICAL IMPRESSION:  Patient arrived with good motivation for completion of pt activities.  Pt continued to show progress with pain control over her trip Pt provided with final HEP for maintenance of her hip and core strength to prevent future onset. Pt also provided with education for management of any future back or hip exacerbation as well as provided with warm up for pickleball that would reduce or risk for pain after this activity and prevent injury. Pt will be discharged from formal PT this date with all needs met and all goals met as pt is now able to tie shoes with min difficulty. Recert this date  as pt was outside of cert since her last visit.     OBJECTIVE IMPAIRMENTS: Abnormal gait, decreased activity tolerance, decreased mobility, decreased strength, and pain.   ACTIVITY LIMITATIONS: squatting and locomotion level  PARTICIPATION LIMITATIONS: community activity and yard work  PERSONAL FACTORS: 3+ comorbidities: GERD, HLD, HTN are also affecting patient's functional outcome.   REHAB POTENTIAL: Good  CLINICAL DECISION MAKING: Evolving/moderate complexity  EVALUATION COMPLEXITY: Moderate   GOALS: Goals reviewed with patient? Yes  SHORT TERM GOALS: Target date: 04/20/2024     Patient will be independent in home exercise program to improve strength/mobility for better functional independence with ADLs. Baseline: No HEP currently 6/2: Pt has been completing 4-5 times per week.  Goal status: MET    LONG TERM GOALS: Target date: 05/18/2024   1.  Patient  will complete five times sit to stand test in < 11 seconds indicating an increased LE strength and improved balance. Baseline: 14.6 s 6/2:8.68 sec  Goal status: MET  2.  Patient will improve LEFS score to 44   to demonstrate statistically significant improvement in mobility and quality of life as it relates to their LE pain.  Baseline: 34 6/2:63  Goal status: MET    3.  Patient will improve left and right hip abduction and flexion strength by 5 pounds of force or greater using active force tools in order to indicate improved lower extremity strength for activities such as pickleball. Baseline: see eval chart  Goal status: MET  4.  Patient report moderate difficulty or less difficulty with putting on her socks and shoes in order to indicate improved hip mobility for functional activities.  Baseline: quite a bit of difficulty 6/2: Pt having light to moderate difficulty on the R and none on the left 6/20: a little difficult on the R Goal status: MET     PLAN:  PT FREQUENCY: 2x/week  PT DURATION: 8  weeks  PLANNED INTERVENTIONS: 97750- Physical Performance Testing, 97110-Therapeutic exercises, 97530- Therapeutic activity, 97112- Neuromuscular re-education, 97535- Self Care, [id number]- Manual therapy, [phone]- Gait training, Patient/Family education, Stair training, Taping, Dry Needling, Joint mobilization, Spinal mobilization, Cryotherapy, and Moist heat  PLAN FOR NEXT SESSION:  D/C today   Edwina Gram, PT 05/27/2024, 7:54 AM

## 2024-05-31 ENCOUNTER — Ambulatory Visit: Admitting: Physical Therapy

## 2024-06-03 ENCOUNTER — Ambulatory Visit: Admitting: Physical Therapy

## 2024-06-07 ENCOUNTER — Ambulatory Visit

## 2024-06-14 ENCOUNTER — Encounter: Admitting: Physical Therapy

## 2024-06-17 ENCOUNTER — Encounter: Admitting: Physical Therapy

## 2024-06-21 ENCOUNTER — Encounter: Admitting: Physical Therapy

## 2024-06-24 ENCOUNTER — Encounter: Admitting: Physical Therapy

## 2024-06-28 ENCOUNTER — Encounter: Admitting: Physical Therapy

## 2024-07-01 ENCOUNTER — Encounter: Admitting: Physical Therapy

## 2024-07-05 ENCOUNTER — Encounter: Admitting: Physical Therapy

## 2024-07-07 ENCOUNTER — Encounter

## 2024-07-12 ENCOUNTER — Encounter: Admitting: Physical Therapy

## 2024-07-14 ENCOUNTER — Encounter: Admitting: Physical Therapy

## 2024-07-19 ENCOUNTER — Encounter: Admitting: Physical Therapy

## 2024-07-21 ENCOUNTER — Encounter: Admitting: Physical Therapy

## 2024-08-26 ENCOUNTER — Other Ambulatory Visit: Payer: Self-pay | Admitting: Family Medicine

## 2024-12-16 ENCOUNTER — Other Ambulatory Visit: Payer: Self-pay | Admitting: Family Medicine

## 2024-12-16 DIAGNOSIS — Z1231 Encounter for screening mammogram for malignant neoplasm of breast: Secondary | ICD-10-CM

## 2025-01-26 ENCOUNTER — Encounter

## 2025-02-14 ENCOUNTER — Encounter: Admitting: Family Medicine
